# Patient Record
Sex: Male | Born: 1975 | Race: White | Hispanic: No | Marital: Married | Smoking: Former smoker
Health system: Southern US, Community
[De-identification: ages and names within clinical notes are randomized; demographics above are authoritative.]

## PROBLEM LIST (undated history)

## (undated) DIAGNOSIS — J45909 Unspecified asthma, uncomplicated: Secondary | ICD-10-CM

## (undated) HISTORY — PX: SHOULDER SURGERY: SHX246

## (undated) HISTORY — PX: KNEE ARTHROSCOPY WITH ANTERIOR CRUCIATE LIGAMENT (ACL) REPAIR: SHX5644

---

## 2011-08-06 ENCOUNTER — Ambulatory Visit (INDEPENDENT_AMBULATORY_CARE_PROVIDER_SITE_OTHER): Payer: 59

## 2011-08-06 DIAGNOSIS — L42 Pityriasis rosea: Secondary | ICD-10-CM

## 2016-07-16 HISTORY — PX: VASECTOMY: SHX75

## 2016-09-23 ENCOUNTER — Encounter (HOSPITAL_COMMUNITY): Payer: Self-pay | Admitting: Emergency Medicine

## 2016-09-23 ENCOUNTER — Ambulatory Visit (HOSPITAL_COMMUNITY)
Admission: EM | Admit: 2016-09-23 | Discharge: 2016-09-23 | Disposition: A | Discharge status: Home / Self Care | Payer: 59 | Attending: Nurse Practitioner | Admitting: Nurse Practitioner

## 2016-09-23 DIAGNOSIS — J45909 Unspecified asthma, uncomplicated: Secondary | ICD-10-CM | POA: Insufficient documentation

## 2016-09-23 DIAGNOSIS — R42 Dizziness and giddiness: Secondary | ICD-10-CM | POA: Insufficient documentation

## 2016-09-23 DIAGNOSIS — Z87891 Personal history of nicotine dependence: Secondary | ICD-10-CM | POA: Insufficient documentation

## 2016-09-23 HISTORY — DX: Unspecified asthma, uncomplicated: J45.909

## 2016-09-23 LAB — POCT I-STAT, CHEM 8
BUN: 22 mg/dL — ABNORMAL HIGH (ref 6–20)
CALCIUM ION: 1.16 mmol/L (ref 1.15–1.40)
CHLORIDE: 105 mmol/L (ref 101–111)
Creatinine, Ser: 1.4 mg/dL — ABNORMAL HIGH (ref 0.61–1.24)
GLUCOSE: 93 mg/dL (ref 65–99)
HCT: 42 % (ref 39.0–52.0)
Hemoglobin: 14.3 g/dL (ref 13.0–17.0)
Potassium: 4.3 mmol/L (ref 3.5–5.1)
Sodium: 140 mmol/L (ref 135–145)
TCO2: 27 mmol/L (ref 0–100)

## 2016-09-23 LAB — CBC WITH DIFFERENTIAL/PLATELET
Basophils Absolute: 0 10*3/uL (ref 0.0–0.1)
Basophils Relative: 0 %
EOS ABS: 0.1 10*3/uL (ref 0.0–0.7)
Eosinophils Relative: 2 %
HCT: 41.4 % (ref 39.0–52.0)
Hemoglobin: 14 g/dL (ref 13.0–17.0)
LYMPHS ABS: 2.2 10*3/uL (ref 0.7–4.0)
Lymphocytes Relative: 30 %
MCH: 30 pg (ref 26.0–34.0)
MCHC: 33.8 g/dL (ref 30.0–36.0)
MCV: 88.7 fL (ref 78.0–100.0)
MONOS PCT: 10 %
Monocytes Absolute: 0.7 10*3/uL (ref 0.1–1.0)
Neutro Abs: 4.2 10*3/uL (ref 1.7–7.7)
Neutrophils Relative %: 58 %
Platelets: 255 10*3/uL (ref 150–400)
RBC: 4.67 MIL/uL (ref 4.22–5.81)
RDW: 12.5 % (ref 11.5–15.5)
WBC: 7.3 10*3/uL (ref 4.0–10.5)

## 2016-09-23 LAB — COMPREHENSIVE METABOLIC PANEL
ALT: 25 U/L (ref 17–63)
ANION GAP: 9 (ref 5–15)
AST: 21 U/L (ref 15–41)
Albumin: 4.1 g/dL (ref 3.5–5.0)
Alkaline Phosphatase: 45 U/L (ref 38–126)
BUN: 18 mg/dL (ref 6–20)
CHLORIDE: 104 mmol/L (ref 101–111)
CO2: 25 mmol/L (ref 22–32)
Calcium: 9 mg/dL (ref 8.9–10.3)
Creatinine, Ser: 1.27 mg/dL — ABNORMAL HIGH (ref 0.61–1.24)
Glucose, Bld: 89 mg/dL (ref 65–99)
Potassium: 4.1 mmol/L (ref 3.5–5.1)
SODIUM: 138 mmol/L (ref 135–145)
Total Bilirubin: 0.4 mg/dL (ref 0.3–1.2)
Total Protein: 6.8 g/dL (ref 6.5–8.1)

## 2016-09-23 MED ORDER — MECLIZINE HCL 25 MG PO TABS: 25.0000 mg | ORAL_TABLET | Freq: Three times a day (TID) | ORAL | 0 refills | Status: DC | PRN

## 2016-09-23 NOTE — ED Triage Notes (Signed)
The [facility] with a complaint of bilateral lower back pain x 3 weeks off and on. The patient also complained of dizziness x 1 week.

## 2016-09-23 NOTE — Discharge Instructions (Signed)
I will call you when the lab results are back (It will most likely come back tomorrow).

## 2016-09-23 NOTE — ED Provider Notes (Signed)
CSN: [id number]     Arrival date & time 09/23/16  1850 History   First MD Initiated Contact with Patient 09/23/16 1917     Chief Complaint  Patient presents with  . Back Pain  . Dizziness   (Consider location/radiation/quality/duration/timing/severity/associated sxs/prior Treatment) Patient is otherwise a healthy 41 y.o. Male, with history of asthma that is well controlled and seldom uses his albuterol, presents today for dizziness of 1 week's duration that is getting worst and more frequent in the last 2 days accompany by a needling pricking sensation at his left lower back that "feels superficial" on his skin rather than something deep and this needle pricking sensation has been ongoing for 3 weeks.   Patient reports that sometimes he feels like the room is spinning a bit but overall more like a lightheadedness. He reports drinking the same amt of water that he has always been drinking; he doesn't think that he  Is dehydrated. He denies tinnitus, hearing loss or ear pain. He denies CP, SOB, Headache, visual disturbances. He denies muscle weakness, numbness or tingling sensation, or nausea. He denies any alleviating and aggravation factors. He states that dizzy spell would last for hours when it comes.   He reports that about 9 months ago is when he started to experience intermittent chest pain that eventually went away 3 months after successful smoking cessation. Patient reports that this chest pain has not returned. He did not experience any dizziness along with this chest pain in the past.   He does not have a PCP. It has been a long time since he had any labs done.         Past Medical History:  Diagnosis Date  . Asthma    Past Surgical History:  Procedure Laterality Date  . KNEE ARTHROSCOPY WITH ANTERIOR CRUCIATE LIGAMENT (ACL) REPAIR Left   . SHOULDER SURGERY Right    History reviewed. No pertinent family history. Social History  Substance Use Topics  . Smoking status:  Former Games developermoker  . Smokeless tobacco: Never Used  . Alcohol use Yes    Review of Systems  Constitutional:       As stated in the HPI    Allergies  Patient has no known allergies.  Home Medications   Prior to Admission medications   Medication Sig Start Date End Date Taking? Authorizing Provider  meclizine (ANTIVERT) 25 MG tablet Take 1 tablet (25 mg total) by mouth 3 (three) times daily as needed for dizziness. 09/23/16   Lucia EstelleFeng Azadeh Hyder, NP   Meds Ordered and Administered this Visit  Medications - No data to display  BP 103/70 (BP Location: Right Arm)   Pulse 70   Temp 98.5 F (36.9 C) (Oral)   Resp 18   SpO2 98%  Orthostatic VS for the past 24 hrs:  BP- Lying Pulse- Lying BP- Sitting Pulse- Sitting BP- Standing at 0 minutes Pulse- Standing at 0 minutes  09/23/16 1942 105/78 56 113/78 64 105/82 70    Physical Exam  Constitutional: He is oriented to person, place, and time. He appears well-developed and well-nourished.  HENT:  Head: Normocephalic and atraumatic.  Right Ear: External ear normal.  Left Ear: External ear normal.  Nose: Nose normal.  Mouth/Throat: Oropharynx is clear and moist. No oropharyngeal exudate.  TM pearly gray bilaterally. Negative nystagmus. EOMI  Eyes: Conjunctivae are normal. Pupils are equal, round, and reactive to light.  Neck: Normal range of motion. Neck supple.  Cardiovascular: Normal rate, regular rhythm and normal  heart sounds.   No murmur heard. Pulmonary/Chest: Effort normal and breath sounds normal.  Abdominal: Soft. Bowel sounds are normal. He exhibits no distension and no mass. There is no tenderness. There is no rebound.  Musculoskeletal: Normal range of motion.  Lymphadenopathy:    He has no cervical adenopathy.  Neurological: He is alert and oriented to person, place, and time. He displays normal reflexes. No cranial nerve deficit or sensory deficit. He exhibits normal muscle tone. Coordination normal.  Skin: Skin is warm and dry.   Nursing note and vitals reviewed.   Urgent Care Course     Procedures (including critical care time)  Labs Review Labs Reviewed  POCT I-STAT, CHEM 8 - Abnormal; Notable for the following:       Result Value   BUN 22 (*)    Creatinine, Ser 1.40 (*)    All other components within normal limits  CBC WITH DIFFERENTIAL/PLATELET  COMPREHENSIVE METABOLIC PANEL    Imaging Review No results found.  MDM   1. Dizziness    No clear etiology. Physical exam and neurologically intact. He appears well. He is stable to be discharged home.  Orthostatic negative. However I-stat shows mildly elevated BUN and Creatinine; this could be due to several factors. CBC with diff and CMP ordered to further evaluate. Will call patient with lab results. Meanwhile call PCP office and see if new patient appt could be moved up to next 1-2 weeks. RX for meclizine given to try. ER precaution discussed.     Lucia Estelle, NP 09/23/16 2021

## 2016-09-25 ENCOUNTER — Encounter: Payer: Self-pay | Admitting: Family Medicine

## 2016-09-25 ENCOUNTER — Ambulatory Visit (INDEPENDENT_AMBULATORY_CARE_PROVIDER_SITE_OTHER): Payer: 59 | Admitting: Family Medicine

## 2016-09-25 VITALS — BP 128/80 | HR 67 | Resp 12 | Ht 75.0 in | Wt 198.5 lb

## 2016-09-25 DIAGNOSIS — R0789 Other chest pain: Secondary | ICD-10-CM

## 2016-09-25 DIAGNOSIS — R42 Dizziness and giddiness: Secondary | ICD-10-CM

## 2016-09-25 DIAGNOSIS — R002 Palpitations: Secondary | ICD-10-CM | POA: Diagnosis not present

## 2016-09-25 NOTE — Progress Notes (Signed)
HPI:   ACUTE VISIT:  Chief Complaint  Patient presents with  . Dizziness    Mr.William Harding is a 41 y.o. male, who is here today with his wife complaining of 10 days of dizziness, which has ben constant for the past 3 days until this morning.  He describes dizziness as lightheadedness sensation, it does not happen while he is in bed or upon getting up, usually starts about an hour or so of being up. Today am it lasted about 1.5 hours.  She denies associated headache, visual changes, ear ache, tinnitus, hearing loss, dyspnea, nausea, vomiting, or focal weakness.  He has not identified exacerbating or alleviating factors. He was evaluated for similar symptom In the ER on 09/23/16, he was prescribed Meclizine but did not help.  Lab Results  Component Value Date   WBC 7.3 09/23/2016   HGB 14.0 09/23/2016   HCT 41.4 09/23/2016   MCV 88.7 09/23/2016   PLT 255 09/23/2016     Lab Results  Component Value Date   CREATININE 1.27 (H) 09/23/2016   BUN 18 09/23/2016   NA 138 09/23/2016   K 4.1 09/23/2016   CL 104 09/23/2016   CO2 25 09/23/2016    -He is also c/o intermittent sharp, no radiated, chest pain since 01/2016 and improved after smoking sensation 06/2016, almost resolved.States that now it happens "once in a while." He can pinpoint areas of pain on his left chest wall, medial and lateral. No edema,erythema,or rash. Pain was at rest,llasted hours, not sure about exacerbating or alleviating factors.  -Palpitation episodes, he cannot described sensation, denies feeling his heart skipping beats. He has had 2 episodes in the past 2 months, feels like he is "going to pas out", lightheaded, episode lasts about 5 seconds. No associated chest pain,dyspnea,or diaphoresis. It happens at rest. He is afraid of having atrial fib.  He denies anxiety or unusual stress.    Review of Systems  Constitutional: Negative for activity change, appetite change, fatigue and fever.  HENT:  Negative for congestion, ear pain, hearing loss, mouth sores, nosebleeds, sinus pressure, sore throat, tinnitus and trouble swallowing.   Eyes: Negative for redness and visual disturbance.  Respiratory: Negative for cough, shortness of breath and wheezing.   Cardiovascular: Positive for chest pain and palpitations. Negative for leg swelling.  Gastrointestinal: Positive for nausea. Negative for abdominal pain and vomiting.  Endocrine: Negative for cold intolerance, heat intolerance, polydipsia, polyphagia and polyuria.  Genitourinary: Negative for decreased urine volume, dysuria and hematuria.  Musculoskeletal: Negative for back pain, gait problem and myalgias.  Skin: Negative for color change and rash.  Allergic/Immunologic: Negative for environmental allergies.  Neurological: Positive for light-headedness. Negative for tremors, syncope, speech difficulty, weakness, numbness and headaches.  Hematological: Negative for adenopathy. Does not bruise/bleed easily.  Psychiatric/Behavioral: Negative for confusion and sleep disturbance. The patient is not nervous/anxious.       Current Outpatient Prescriptions on File Prior to Visit  Medication Sig Dispense Refill  . meclizine (ANTIVERT) 25 MG tablet Take 1 tablet (25 mg total) by mouth 3 (three) times daily as needed for dizziness. 30 tablet 0   No current facility-administered medications on file prior to visit.      Past Medical History:  Diagnosis Date  . Asthma    No Known Allergies  Social History   Social History  . Marital status: Married    Spouse name: N/A  . Number of children: N/A  . Years of education: N/A  Social History Main Topics  . Smoking status: Former Games developer  . Smokeless tobacco: Never Used  . Alcohol use Yes  . Drug use: Yes    Types: Marijuana  . Sexual activity: Not Asked   Other Topics Concern  . None   Social History Narrative  . None    Vitals:   09/25/16 1448  BP: 128/80  Pulse: 67  Resp:  12  O2 sat 98% at RA. Body mass index is 24.81 kg/m.   Physical Exam  Nursing note and vitals reviewed. Constitutional: He is oriented to person, place, and time. He appears well-developed and well-nourished. No distress.  HENT:  Head: Atraumatic.  Right Ear: Hearing, tympanic membrane, external ear and ear canal normal.  Left Ear: Hearing, tympanic membrane, external ear and ear canal normal.  Mouth/Throat: Oropharynx is clear and moist and mucous membranes are normal.  Lightheadedness is not elicited by head movement.  Eyes: Conjunctivae and EOM are normal. Pupils are equal, round, and reactive to light.  Neck: Normal range of motion. No JVD present. Carotid bruit is not present. No tracheal deviation present. No thyroid mass and no thyromegaly present.  Mild fulness (1-2 cm area) noted upon palpation of right posterior cervical area. I cannot define clear borders,no tender,and barely appreciated upon inspection.   Cardiovascular: Normal rate.  An irregular rhythm present.  No murmur heard. Pulses:      Dorsalis pedis pulses are 2+ on the right side, and 2+ on the left side.  Respiratory: Effort normal and breath sounds normal. No stridor. No respiratory distress.  GI: Soft. He exhibits no mass. There is no tenderness.  Musculoskeletal: He exhibits no edema or tenderness.  Lymphadenopathy:    He has cervical adenopathy.       Right cervical: Posterior cervical (? Of enlarged lymph node) adenopathy present.       Right: No supraclavicular adenopathy present.       Left: No supraclavicular adenopathy present.  Neurological: He is alert and oriented to person, place, and time. No cranial nerve deficit or sensory deficit. Coordination and gait normal.  Reflex Scores:      Patellar reflexes are 2+ on the right side and 2+ on the left side. Skin: Skin is warm. No rash noted. No erythema.  Psychiatric: His mood appears anxious.  Well groomed, good eye contact.      ASSESSMENT AND  PLAN:  Armend was seen today for dizziness.  Diagnoses and all orders for this visit:  Lightheadedness  This problem seems to be different to lightheaded sensation he is also reporting with palpitations.  We discussed possible etiologies,it does not sound like vertigo. Fall precautions discussed.  He has recent lab work,otherwsie negative. Cr mildly elevated, it was 1.4 and then 1.27. He denies using any protein shakes os supplements to "buld muscle." Clearly instructed about warning signs. He has an appt in a couple weeks to establish care,we will plan of fasting labs then.  -     EKG 12-Lead -     Ambulatory referral to Cardiology  Heart palpitations  Because episodes of palpitation associated with feeling like "passing out",presyncope, cardiology consultation recommended, ? Arrhythmia. EKG today otherwise normal,sinus arrhythmia. No other EKG for comparison. Instructed about warning signs. .  -     EKG 12-Lead  Chest wall pain  Resolved. Provided Hx suggest musculoskeletal type pain. I do not think imaging is needed today, lung auscultation normal. F/U as needed.  -     EKG 12-Lead   -  In regard to neck/cervical evaluation, we will re-evaluate next OV. He and his wife will monitor area for changes.   Return for Keep next appt..     -Mr.Aryan Sparks was advised to return or notify a doctor immediately if symptoms worsen or new concerns arise.       Brittani Purdum G. [place], MD  [facility]. [place] office.

## 2016-09-25 NOTE — Patient Instructions (Signed)
A few things to remember from today's visit:   Lightheadedness - Plan: EKG 12-Lead, Ambulatory referral to Cardiology  Fall precautions.  Adequate hydration.  Please be sure medication list is accurate. If a new problem present, please set up appointment sooner than planned today.

## 2016-09-25 NOTE — Progress Notes (Signed)
Pre visit review using our clinic review tool, if applicable. No additional management support is needed unless otherwise documented below in the visit note. 

## 2016-09-26 ENCOUNTER — Ambulatory Visit: Payer: 59 | Admitting: Family Medicine

## 2016-09-27 ENCOUNTER — Encounter: Payer: Self-pay | Admitting: Cardiology

## 2016-09-27 NOTE — Progress Notes (Signed)
Cardiology Office Note   Date:  09/30/2016   ID:  William Harding, DOB July 05, 1976, MRN [id number]  PCP:  Betty SwazilandJordan, MD  Cardiologist:   Rollene RotundaJames Sanjay Broadfoot, MD  Referring:  Betty [place]Jordan, MD   Chief Complaint  Patient presents with  . Dizziness      History of Present Illness: William PerchesJason Harb is a 41 y.o. male who presents for evaluation of dizziness and palpitations.  He has no prior cardiac history. However, he has long-standing smoking history. Last year he was having some chest discomfort. This was under his left chest and radiating around slightly to his axilla. This was somewhat fleeting that there was recurrence of this. It would happen at rest. He didn't notice it with physical activity. He was smoking at least a half pack of cigarettes a day at that time and had been for years. He stopped smoking in December and says this discomfort has improved. He also notices palpitations. This started about 2 months ago. He feels a flip-flop or lightheadedness. He doesn't describe heart racing. This seems to happen at rest and not be brought on by exertion. He denies any presyncope or syncope. He does have dizziness. He had this for 3 days last week. Seems to stay on him for most of the day when it does happen. He doesn't describe positional changes. He does exercise. With his exercise he can bring on any of these symptoms. He drinks one be coffee per day. He did notice he was having more palpitations when he drank alcohol he was drinking about 3 glasses of wine per night. He's really reduce this.  Past Medical History:  Diagnosis Date  . Asthma     Past Surgical History:  Procedure Laterality Date  . KNEE ARTHROSCOPY WITH ANTERIOR CRUCIATE LIGAMENT (ACL) REPAIR Left   . SHOULDER SURGERY Right      No current outpatient prescriptions on file.   No current facility-administered medications for this visit.     Allergies:   Patient has no known allergies.    Social History:  The patient   reports that he has quit smoking. His smoking use included Cigarettes. He has never used smokeless tobacco. He reports that he drinks alcohol. He reports that he uses drugs, including Marijuana.   Family History:  The patient's  family history includes Cancer in his mother; Diabetes in his mother; Peripheral vascular disease in his father.    ROS:  Please see the history of present illness.   Otherwise, review of systems are positive for none.   All other systems are reviewed and negative.    PHYSICAL EXAM: VS:  BP 120/74 (BP Location: Left Arm, Cuff Size: Normal)   Pulse 75   Ht 6\' 3"  (1.905 m)   Wt 197 lb (89.4 kg)   BMI 24.62 kg/m  , BMI Body mass index is 24.62 kg/m. GENERAL:  Well appearing HEENT:  Pupils equal round and reactive, fundi not visualized, oral mucosa unremarkable NECK:  No jugular venous distention, waveform within normal limits, carotid upstroke brisk and symmetric, no bruits, no thyromegaly LYMPHATICS:  No cervical, inguinal adenopathy LUNGS:  Clear to auscultation bilaterally BACK:  No CVA tenderness CHEST:  Unremarkable HEART:  PMI not displaced or sustained,S1 and S2 within normal limits, no S3, no S4, no clicks, no rubs, no murmurs ABD:  Flat, positive bowel sounds normal in frequency in pitch, no bruits, no rebound, no guarding, no midline pulsatile mass, no hepatomegaly, no splenomegaly EXT:  2  plus pulses throughout, no edema, no cyanosis no clubbing SKIN:  No rashes no nodules NEURO:  Cranial nerves II through XII grossly intact, motor grossly intact throughout PSYCH:  Cognitively intact, oriented to person place and time    EKG:  EKG is not ordered today. The ekg ordered 09/25/16 demonstrates sinus rhythm, rate 60, axis within normal limits, intervals within normal limits, no acute ST-T wave changes.    Recent Labs: 09/23/2016: ALT 25; BUN 18; Creatinine, Ser 1.27; Hemoglobin 14.0; Platelets 255; Potassium 4.1; Sodium 138    Lipid Panel No results  found for: CHOL, TRIG, HDL, CHOLHDL, VLDL, LDLCALC, LDLDIRECT    Wt Readings from Last 3 Encounters:  09/28/16 197 lb (89.4 kg)  09/25/16 198 lb 8 oz (90 kg)      Other studies Reviewed: Additional studies/ records that were reviewed today include: None. Review of the above records demonstrates:  Please see elsewhere in the note.     ASSESSMENT AND PLAN:  PALPITATIONS:  At this point he's probably describing PACs or PVCs. We discussed symptomatic treatment of these but at this point will probably ignore them. He is going to think about getting [facility] for evaluation of his palpitations.     DIZZINESS:Begin he'll evaluate this as above. Strongly suspect a cardiac etiology. This doesn't sound like it's associated with palpitations. He is not describing orthostatic symptoms. No other change in therapy is indicated at this point. He might consider ENT evaluation if this persists.   CHEST PAIN:  This is atypical. I think the pretest probability of obstructive coronary disease is low. He does have crepitus risk factors with his long-standing smoking history. I'm going to order coronary calcium score.      Current medicines are reviewed at length with the patient today.  The patient does not have concerns regarding medicines.  The following changes have been made:  no change  Labs/ tests ordered today include:   Orders Placed This Encounter  Procedures  . CT CARDIAC SCORING     Disposition:   FU with me as needed.      Signed, Rollene Rotunda, MD  09/30/2016 3:10 PM    [facility]

## 2016-09-28 ENCOUNTER — Ambulatory Visit (INDEPENDENT_AMBULATORY_CARE_PROVIDER_SITE_OTHER): Payer: 59 | Admitting: Cardiology

## 2016-09-28 ENCOUNTER — Encounter: Payer: Self-pay | Admitting: Cardiology

## 2016-09-28 VITALS — BP 120/74 | HR 75 | Ht 75.0 in | Wt 197.0 lb

## 2016-09-28 DIAGNOSIS — R42 Dizziness and giddiness: Secondary | ICD-10-CM

## 2016-09-28 DIAGNOSIS — R079 Chest pain, unspecified: Secondary | ICD-10-CM | POA: Diagnosis not present

## 2016-09-28 DIAGNOSIS — R002 Palpitations: Secondary | ICD-10-CM

## 2016-09-28 NOTE — Patient Instructions (Signed)
Medication Instructions:  Continue current medications  Labwork: None ordered  Testing/Procedures: Your physician has requested that you have a Coronary Calcium Score. This test [facility] done [facility] [address] [facility].   Follow-Up: Your physician recommends that you schedule a follow-up appointment in: As Needed   Any Other Special Instructions Will Be Listed Below (If Applicable).     If you need a refill on your cardiac medications before your next appointment, please call your pharmacy.

## 2016-09-30 ENCOUNTER — Encounter: Payer: Self-pay | Admitting: Cardiology

## 2016-09-30 DIAGNOSIS — R002 Palpitations: Secondary | ICD-10-CM | POA: Insufficient documentation

## 2016-09-30 DIAGNOSIS — R079 Chest pain, unspecified: Secondary | ICD-10-CM | POA: Insufficient documentation

## 2016-09-30 DIAGNOSIS — R42 Dizziness and giddiness: Secondary | ICD-10-CM | POA: Insufficient documentation

## 2016-10-03 ENCOUNTER — Ambulatory Visit (INDEPENDENT_AMBULATORY_CARE_PROVIDER_SITE_OTHER)
Admission: RE | Admit: 2016-10-03 | Discharge: 2016-10-03 | Disposition: A | Discharge status: Home / Self Care | Payer: Self-pay | Source: Ambulatory Visit | Attending: Cardiology | Admitting: Cardiology

## 2016-10-03 DIAGNOSIS — R079 Chest pain, unspecified: Secondary | ICD-10-CM

## 2016-10-08 ENCOUNTER — Ambulatory Visit: Payer: 59 | Admitting: Family Medicine

## 2016-10-09 ENCOUNTER — Ambulatory Visit: Payer: 59 | Admitting: Cardiology

## 2016-10-10 NOTE — Progress Notes (Signed)
HPI:   Mr.Marcelus Collymore is a 41 y.o. male, who is here today to establish care.  I saw him last on 09/25/16 for acute visit,palpitations and dizziness.  Since his last OV he was evaluated by cardiologists, Dr Antoine Poche. Coronary calcium score was obtained, 10/03/16: It was 0. Chest pain has resolved.  Former PCP: N/A  Concerns today: None  Lightheadedness: Improved. He had an episode this morning that last a few seconds but this one was the first one since his last OV.   Palpitations: Resolved.  He is still exercising regularly and feels better after doing so. He denies any chest discomfort, dyspnea,or palpitations with exertions.  He uses marijuana occasionally. Denies depressed mood or anxiety.  Lab Results  Component Value Date   CREATININE 1.27 (H) 09/23/2016   BUN 18 09/23/2016   NA 138 09/23/2016   K 4.1 09/23/2016   CL 104 09/23/2016   CO2 25 09/23/2016   He denies alcohol abuse,drinks wine during weekends, 2-3 glasses at the time.   Review of Systems  Constitutional: Negative for activity change, appetite change, fatigue, fever and unexpected weight change.  HENT: Negative for mouth sores, nosebleeds, sore throat and trouble swallowing.   Eyes: Negative for pain and visual disturbance.  Respiratory: Negative for cough, shortness of breath and wheezing.   Cardiovascular: Negative for chest pain, palpitations and leg swelling.  Gastrointestinal: Negative for abdominal pain, nausea and vomiting.       No changes in bowel habits.  Genitourinary: Negative for decreased urine volume and hematuria.  Musculoskeletal: Negative for gait problem and myalgias.  Skin: Negative for rash.  Neurological: Negative for syncope, weakness and headaches.  Hematological: Negative for adenopathy. Does not bruise/bleed easily.  Psychiatric/Behavioral: Negative for confusion and sleep disturbance. The patient is not nervous/anxious.       No current outpatient prescriptions  on file prior to visit.   No current facility-administered medications on file prior to visit.      Past Medical History:  Diagnosis Date  . Asthma    No Known Allergies  Family History  Problem Relation Age of Onset  . Diabetes Mother   . Cancer Mother     breast cancer  . Peripheral vascular disease Father   . Hyperlipidemia Father   . Diabetes Maternal Grandmother   . Stroke Maternal Grandmother   . Cancer Paternal Uncle 48    lung    Social History   Social History  . Marital status: Divorced    Spouse name: N/A  . Number of children: 1  . Years of education: N/A   Social History Main Topics  . Smoking status: Former Smoker    Types: Cigarettes  . Smokeless tobacco: Never Used     Comment: Quit Dec 2017  . Alcohol use Yes  . Drug use: Yes    Types: Marijuana  . Sexual activity: Yes   Other Topics Concern  . None   Social History Narrative  . None    Vitals:   10/11/16 1154  BP: 120/80  Pulse: 71  Resp: 12   O2 sat 99% at RA Body mass index is 24.65 kg/m.   Physical Exam  Nursing note and vitals reviewed. Constitutional: He is oriented to person, place, and time. He appears well-developed and well-nourished. No distress.  HENT:  Head: Atraumatic.  Mouth/Throat: Uvula is midline, oropharynx is clear and moist and mucous membranes are normal.  Eyes: Conjunctivae and EOM are normal. Pupils are  equal, round, and reactive to light.  Cardiovascular: Normal rate and regular rhythm.   No murmur heard. Pulses:      Dorsalis pedis pulses are 2+ on the right side, and 2+ on the left side.  Respiratory: Effort normal and breath sounds normal. No respiratory distress.  GI: Soft. He exhibits no mass. There is no hepatomegaly. There is no tenderness.  Musculoskeletal: He exhibits no edema.  Lymphadenopathy:    He has no cervical adenopathy.  Neurological: He is alert and oriented to person, place, and time. Gait normal.  Skin: Skin is warm. No rash  noted. No erythema.  Psychiatric: He has a normal mood and affect.  Well groomed, good eye contact.      ASSESSMENT AND PLAN:   Barbara CowerJason was seen today for establish care.  Diagnoses and all orders for this visit:  Dizziness  Improved. Adequate hydration,healthy diet recommended. Alcohol intake: avoid > 8 oz of daily alcohol. Instructed about warning signs. F/U as needed.  Lipid screening -     Lipid panel  Palpitations  Resolved. Limit caffeine and alcohol intake. Instructed about warning signs. F/U as needed.      Eirene Rather G. [place], MD  [facility]. [place] office.

## 2016-10-11 ENCOUNTER — Ambulatory Visit (INDEPENDENT_AMBULATORY_CARE_PROVIDER_SITE_OTHER): Payer: 59 | Admitting: Family Medicine

## 2016-10-11 ENCOUNTER — Encounter: Payer: Self-pay | Admitting: Family Medicine

## 2016-10-11 VITALS — BP 120/80 | HR 71 | Resp 12 | Ht 75.0 in | Wt 197.2 lb

## 2016-10-11 DIAGNOSIS — R002 Palpitations: Secondary | ICD-10-CM

## 2016-10-11 DIAGNOSIS — R42 Dizziness and giddiness: Secondary | ICD-10-CM

## 2016-10-11 DIAGNOSIS — Z13220 Encounter for screening for lipoid disorders: Secondary | ICD-10-CM | POA: Diagnosis not present

## 2016-10-11 LAB — LIPID PANEL
CHOL/HDL RATIO: 6
Cholesterol: 280 mg/dL — ABNORMAL HIGH (ref 0–200)
HDL: 48.6 mg/dL (ref >39.00)
NonHDL: 231.12
TRIGLYCERIDES: 222 mg/dL — AB (ref 0.0–149.0)
VLDL: 44.4 mg/dL — AB (ref 0.0–40.0)

## 2016-10-11 LAB — LDL CHOLESTEROL, DIRECT: Direct LDL: 177 mg/dL

## 2016-10-11 NOTE — Progress Notes (Signed)
Pre visit review using our clinic review tool, if applicable. No additional management support is needed unless otherwise documented below in the visit note. 

## 2016-10-11 NOTE — Patient Instructions (Signed)
A few things to remember from today's visit:   Lipid screening - Plan: Lipid panel  Palpitations   We have ordered labs or studies at this visit.  It can take up to 1-2 weeks for results and processing. IF results require follow up or explanation, we will call you with instructions. Clinically stable results will be released to your [facility]. If you have not heard from us or cannot find your results in [facility] in 2 weeks please contact our office at [phone].  If you are not yet signed up for [facility], please consider signing up  Please be sure medication list is accurate. If a new problem present, please set up appointment sooner than planned today.

## 2016-10-14 ENCOUNTER — Encounter: Payer: Self-pay | Admitting: Family Medicine

## 2016-10-14 DIAGNOSIS — E782 Mixed hyperlipidemia: Secondary | ICD-10-CM

## 2016-10-14 HISTORY — DX: Mixed hyperlipidemia: E78.2

## 2016-10-15 ENCOUNTER — Other Ambulatory Visit: Payer: Self-pay

## 2016-10-15 MED ORDER — SIMVASTATIN 20 MG PO TABS: ORAL_TABLET | ORAL | 1 refills | Status: DC

## 2016-11-01 ENCOUNTER — Other Ambulatory Visit: Payer: Self-pay

## 2016-11-01 ENCOUNTER — Encounter: Payer: Self-pay | Admitting: Neurology

## 2016-11-01 ENCOUNTER — Encounter (HOSPITAL_COMMUNITY): Payer: Self-pay | Admitting: Emergency Medicine

## 2016-11-01 ENCOUNTER — Emergency Department (HOSPITAL_COMMUNITY)
Admission: EM | Admit: 2016-11-01 | Discharge: 2016-11-01 | Disposition: A | Discharge status: Home / Self Care | Payer: 59 | Attending: Emergency Medicine | Admitting: Emergency Medicine

## 2016-11-01 DIAGNOSIS — R42 Dizziness and giddiness: Secondary | ICD-10-CM | POA: Diagnosis present

## 2016-11-01 DIAGNOSIS — H5500 Unspecified nystagmus: Secondary | ICD-10-CM

## 2016-11-01 DIAGNOSIS — J45909 Unspecified asthma, uncomplicated: Secondary | ICD-10-CM | POA: Diagnosis not present

## 2016-11-01 DIAGNOSIS — Z87891 Personal history of nicotine dependence: Secondary | ICD-10-CM | POA: Diagnosis not present

## 2016-11-01 DIAGNOSIS — Z79899 Other long term (current) drug therapy: Secondary | ICD-10-CM | POA: Insufficient documentation

## 2016-11-01 MED ORDER — MECLIZINE HCL 25 MG PO TABS: 25.0000 mg | ORAL_TABLET | Freq: Three times a day (TID) | ORAL | 0 refills | Status: DC | PRN

## 2016-11-01 NOTE — ED Provider Notes (Signed)
[facility]-EMERGENCY DEPT Provider Note   CSN: [id number] Arrival date & time: 11/01/16  0935     History   Chief Complaint Chief Complaint  Patient presents with  . Dizziness    HPI William Harding is a 41 y.o. male.  41 yo M with a chief complaints of lightheadedness. This been going on for the past month and a half. Happened spontaneously. Usually happens in the afternoons. He describes a sensation as the feeling of seasickness without the rocking. Nothing seems to make this better or worse. Denies head injury denies vomiting. Denies hearing loss or tinnitus. Has been able to exercise vigorously without symptoms. Exercise usually makes it better. Denies chest pain or shortness of breath. Denies headaches. Denies trouble with his vision. Denies unilateral weakness or numbness.   The history is provided by the patient and the spouse.  Dizziness  Quality:  Lightheadedness Severity:  Moderate Onset quality:  Gradual Duration:  2 months Timing:  Intermittent Progression:  Unchanged Chronicity:  New Context: not when bending over and not with head movement   Context comment:  Spontaneously Relieved by:  Nothing Worsened by:  Nothing Ineffective treatments:  None tried Associated symptoms: no chest pain, no diarrhea, no headaches, no palpitations, no shortness of breath and no vomiting     Past Medical History:  Diagnosis Date  . Asthma     Patient Active Problem List   Diagnosis Date Noted  . Mixed hyperlipidemia 10/14/2016  . Chest pain 09/30/2016  . Dizziness 09/30/2016  . Palpitations 09/30/2016    Past Surgical History:  Procedure Laterality Date  . KNEE ARTHROSCOPY WITH ANTERIOR CRUCIATE LIGAMENT (ACL) REPAIR Left   . SHOULDER SURGERY Right   . VASECTOMY  2018       Home Medications    Prior to Admission medications   Medication Sig Start Date End Date Taking? Authorizing Provider  meclizine (ANTIVERT) 25 MG tablet Take 1 tablet (25 mg total) by mouth 3 (three)  times daily as needed for dizziness. 11/01/16   Melene Plan, DO  simvastatin (ZOCOR) 20 MG tablet Take 1 tablet by mouth daily with supper. 10/15/16   Betty G [place], MD    Family History Family History  Problem Relation Age of Onset  . Diabetes Mother   . Cancer Mother     breast cancer  . Peripheral vascular disease Father   . Hyperlipidemia Father   . Diabetes Maternal Grandmother   . Stroke Maternal Grandmother   . Cancer Paternal Uncle 55    lung    Social History Social History  Substance Use Topics  . Smoking status: Former Smoker    Types: Cigarettes  . Smokeless tobacco: Never Used     Comment: Quit Dec 2017  . Alcohol use Yes     Allergies   Patient has no known allergies.   Review of Systems Review of Systems  Constitutional: Negative for chills and fever.  HENT: Negative for congestion and facial swelling.   Eyes: Negative for discharge and visual disturbance.  Respiratory: Negative for shortness of breath.   Cardiovascular: Negative for chest pain and palpitations.  Gastrointestinal: Negative for abdominal pain, diarrhea and vomiting.  Musculoskeletal: Negative for arthralgias and myalgias.  Skin: Negative for color change and rash.  Neurological: Positive for dizziness and light-headedness. Negative for tremors, syncope and headaches.  Psychiatric/Behavioral: Negative for confusion and dysphoric mood.     Physical Exam Updated Vital Signs BP 134/89 (BP Location: Left Arm)   Pulse 69  Temp 97.8 F (36.6 C) (Oral)   Resp 16   SpO2 100%   Physical Exam  Constitutional: He is oriented to person, place, and time. He appears well-developed and well-nourished.  HENT:  Head: Normocephalic and atraumatic.  Left TM normal. Right TM obscured by wax.  Eyes: EOM are normal. Pupils are equal, round, and reactive to light.  Neck: Normal range of motion. Neck supple. No JVD present.  Cardiovascular: Normal rate and regular rhythm.  Exam reveals no gallop and  no friction rub.   No murmur heard. Pulmonary/Chest: No respiratory distress. He has no wheezes.  Abdominal: He exhibits no distension. There is no rebound and no guarding.  Musculoskeletal: Normal range of motion.  Neurological: He is alert and oriented to person, place, and time. He has normal strength. No cranial nerve deficit or sensory deficit. He displays a negative Romberg sign. Coordination and gait normal. GCS eye subscore is 4. GCS verbal subscore is 5. GCS motor subscore is 6.  Reflex Scores:      Tricep reflexes are 2+ on the right side and 2+ on the left side.      Bicep reflexes are 2+ on the right side and 2+ on the left side.      Brachioradialis reflexes are 2+ on the right side and 2+ on the left side.      Patellar reflexes are 2+ on the right side and 2+ on the left side.      Achilles reflexes are 2+ on the right side and 2+ on the left side. Left-sided fast going nystagmus Dix-Hallpike was negative on that side  Skin: No rash noted. No pallor.  Psychiatric: He has a normal mood and affect. His behavior is normal.  Nursing note and vitals reviewed.    ED Treatments / Results  Labs (all labs ordered are listed, but only abnormal results are displayed) Labs Reviewed - No data to display  EKG  EKG Interpretation  Date/Time:  Thursday November 01 2016 09:39:01 EDT Ventricular Rate:  71 PR Interval:  182 QRS Duration: 86 QT Interval:  380 QTC Calculation: 412 R Axis:   94 Text Interpretation:  Normal sinus rhythm with sinus arrhythmia Rightward axis Borderline ECG No old tracing to compare Confirmed by Vernessa Likes MD, DANIEL ([id number]) on 11/01/2016 10:07:27 AM       Radiology No results found.  Procedures Procedures (including critical care time)  Medications Ordered in ED Medications - No data to display   Initial Impression / Assessment and Plan / ED Course  I have reviewed the triage vital signs and the nursing notes.  Pertinent labs & imaging results that  were available during my care of the patient were reviewed by me and considered in my medical decision making (see chart for details).     41 yo M with a chief complaint of lightheadedness. Going on for a month and a half. Has seen urgent care 3 as well as cardiology. Cardiology does not feel like this is cardiogenic in nature. As patient is continuing to have symptoms and has some left-sided nystagmus suggested follow-up with ENT as well as neurology. Start on meclizine.  10:14 AM:  I have discussed the diagnosis/risks/treatment options with the patient and family and believe the pt to be eligible for discharge home to follow-up with Neuro, ENT, PCP. We also discussed returning to the ED immediately if new or worsening sx occur. We discussed the sx which are most concerning (e.g., inability to walk, exertional symptoms.  Syncope) that necessitate immediate return. Medications administered to the patient during their visit and any new prescriptions provided to the patient are listed below.  Medications given during this visit Medications - No data to display   The patient appears reasonably screen and/or stabilized for discharge and I doubt any other medical condition or other [facility] requiring further screening, evaluation, or treatment in the ED at this time prior to discharge.    Final Clinical Impressions(s) / ED Diagnoses   Final diagnoses:  Lightheaded  Nystagmus    New Prescriptions New Prescriptions   MECLIZINE (ANTIVERT) 25 MG TABLET    Take 1 tablet (25 mg total) by mouth 3 (three) times daily as needed for dizziness.     Melene Plan, DO 11/01/16 1014

## 2016-11-01 NOTE — Discharge Instructions (Signed)
Follow up with neurology and ENT for further evaluation.  Return to the ED for symptoms during exercise, if you pass out, or if you are unable to walk due to dizziness.

## 2016-11-01 NOTE — ED Triage Notes (Signed)
Pt sts dizziness x 1.5 months; pt sent here for further eval

## 2016-11-01 NOTE — ED Notes (Signed)
Got patient ready for discharge 

## 2016-11-14 DIAGNOSIS — R42 Dizziness and giddiness: Secondary | ICD-10-CM | POA: Diagnosis not present

## 2016-12-31 ENCOUNTER — Ambulatory Visit: Payer: 59 | Admitting: Neurology

## 2017-01-25 DIAGNOSIS — S83512D Sprain of anterior cruciate ligament of left knee, subsequent encounter: Secondary | ICD-10-CM | POA: Diagnosis not present

## 2017-01-25 DIAGNOSIS — M25512 Pain in left shoulder: Secondary | ICD-10-CM | POA: Diagnosis not present

## 2017-01-25 DIAGNOSIS — M25562 Pain in left knee: Secondary | ICD-10-CM | POA: Diagnosis not present

## 2017-02-01 DIAGNOSIS — M25562 Pain in left knee: Secondary | ICD-10-CM | POA: Diagnosis not present

## 2017-02-08 ENCOUNTER — Ambulatory Visit: Payer: 59 | Admitting: Neurology

## 2017-02-15 DIAGNOSIS — S83282A Other tear of lateral meniscus, current injury, left knee, initial encounter: Secondary | ICD-10-CM | POA: Diagnosis not present

## 2017-02-15 DIAGNOSIS — M1712 Unilateral primary osteoarthritis, left knee: Secondary | ICD-10-CM | POA: Diagnosis not present

## 2017-04-04 ENCOUNTER — Encounter: Payer: Self-pay | Admitting: Family Medicine

## 2017-04-25 DIAGNOSIS — Z0100 Encounter for examination of eyes and vision without abnormal findings: Secondary | ICD-10-CM | POA: Diagnosis not present

## 2017-04-30 DIAGNOSIS — M25512 Pain in left shoulder: Secondary | ICD-10-CM | POA: Diagnosis not present

## 2017-05-07 DIAGNOSIS — M25512 Pain in left shoulder: Secondary | ICD-10-CM | POA: Diagnosis not present

## 2017-05-13 DIAGNOSIS — M7502 Adhesive capsulitis of left shoulder: Secondary | ICD-10-CM | POA: Diagnosis not present

## 2017-07-11 DIAGNOSIS — M7502 Adhesive capsulitis of left shoulder: Secondary | ICD-10-CM | POA: Diagnosis not present

## 2017-07-12 DIAGNOSIS — M7502 Adhesive capsulitis of left shoulder: Secondary | ICD-10-CM | POA: Diagnosis not present

## 2017-07-17 DIAGNOSIS — M7502 Adhesive capsulitis of left shoulder: Secondary | ICD-10-CM | POA: Diagnosis not present

## 2017-07-22 DIAGNOSIS — M7502 Adhesive capsulitis of left shoulder: Secondary | ICD-10-CM | POA: Diagnosis not present

## 2017-07-24 DIAGNOSIS — M7502 Adhesive capsulitis of left shoulder: Secondary | ICD-10-CM | POA: Diagnosis not present

## 2017-07-29 DIAGNOSIS — M7502 Adhesive capsulitis of left shoulder: Secondary | ICD-10-CM | POA: Diagnosis not present

## 2017-07-31 DIAGNOSIS — M7502 Adhesive capsulitis of left shoulder: Secondary | ICD-10-CM | POA: Diagnosis not present

## 2017-08-07 DIAGNOSIS — M7502 Adhesive capsulitis of left shoulder: Secondary | ICD-10-CM | POA: Diagnosis not present

## 2017-08-12 DIAGNOSIS — M7502 Adhesive capsulitis of left shoulder: Secondary | ICD-10-CM | POA: Diagnosis not present

## 2017-08-14 DIAGNOSIS — M7502 Adhesive capsulitis of left shoulder: Secondary | ICD-10-CM | POA: Diagnosis not present

## 2017-08-21 DIAGNOSIS — M7502 Adhesive capsulitis of left shoulder: Secondary | ICD-10-CM | POA: Diagnosis not present

## 2017-08-27 DIAGNOSIS — M7502 Adhesive capsulitis of left shoulder: Secondary | ICD-10-CM | POA: Diagnosis not present

## 2017-09-02 DIAGNOSIS — M7502 Adhesive capsulitis of left shoulder: Secondary | ICD-10-CM | POA: Diagnosis not present

## 2017-09-10 DIAGNOSIS — M7502 Adhesive capsulitis of left shoulder: Secondary | ICD-10-CM | POA: Diagnosis not present

## 2017-10-02 DIAGNOSIS — M7502 Adhesive capsulitis of left shoulder: Secondary | ICD-10-CM | POA: Diagnosis not present

## 2017-10-14 NOTE — Progress Notes (Signed)
HPI:  Mr. William PerchesJason Querry is a 42 y.o.male here today for his routine physical examination.  Last CPE: 09/2017. He lives with his wife and his sone some weekends.  Regular exercise 3 or more times per week: He exercises daily Following a healthy diet: Yes   Chronic medical problems: HLD, elevated Cr  Hx of STD's: Negative  There is no immunization history on file for this patient.    -Negative for high alcohol intake or Hx of illicit drug use. Former smoker, quit about 2 years ago.  Smoked intermittently since age 42, max 5-8 cigarettes/day.    Hyperlipidemia:  Currently on nonpharmacologic treatment, he never started simvastatin. Following a low fat diet: Yes.   Lab Results  Component Value Date   CHOL 280 (H) 10/11/2016   HDL 48.60 10/11/2016   LDLDIRECT 177.0 10/11/2016   TRIG 222.0 (H) 10/11/2016   CHOLHDL 6 10/11/2016    He has been seen in the past for episodes of dizziness and chest discomfort, states that since he quit coffee intake he has not had any symptom.    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  Review of Systems  Constitutional: Negative for activity change, appetite change, fatigue, fever and unexpected weight change.  HENT: Negative for dental problem, nosebleeds, sore throat, trouble swallowing and voice change.   Eyes: Negative for redness and visual disturbance.  Respiratory: Negative for apnea, cough, shortness of breath and wheezing.   Cardiovascular: Negative for chest pain, palpitations and leg swelling.  Gastrointestinal: Negative for abdominal pain, blood in stool, nausea and vomiting.  Endocrine: Negative for cold intolerance, heat intolerance, polydipsia, polyphagia and polyuria.  Genitourinary: Negative for decreased urine volume, dysuria, genital sores, hematuria and testicular pain.  Musculoskeletal: Positive for arthralgias (Left shoulder, improved after PT). Negative for  myalgias.  Skin: Negative for color change and rash.  Allergic/Immunologic: Positive for environmental allergies.  Neurological: Negative for dizziness, syncope, weakness and headaches.  Hematological: Negative for adenopathy. Does not bruise/bleed easily.  Psychiatric/Behavioral: Negative for confusion and sleep disturbance. The patient is nervous/anxious.   All other systems reviewed and are negative.    Current Outpatient Medications on File Prior to Visit  Medication Sig Dispense Refill  . meclizine (ANTIVERT) 25 MG tablet Take 1 tablet (25 mg total) by mouth 3 (three) times daily as needed for dizziness. (Patient not taking: Reported on 10/15/2017) 30 tablet 0  . simvastatin (ZOCOR) 20 MG tablet Take 1 tablet by mouth daily with supper. (Patient not taking: Reported on 10/15/2017) 90 tablet 1   No current facility-administered medications on file prior to visit.      Past Medical History:  Diagnosis Date  . Asthma     Past Surgical History:  Procedure Laterality Date  . KNEE ARTHROSCOPY WITH ANTERIOR CRUCIATE LIGAMENT (ACL) REPAIR Left   . SHOULDER SURGERY Right   . VASECTOMY  2018    No Known Allergies  Family History  Problem Relation Age of Onset  . Diabetes Mother   . Cancer Mother        breast cancer  . Peripheral vascular disease Father   . Hyperlipidemia Father   . Diabetes Maternal Grandmother   . Stroke Maternal Grandmother   . Cancer Paternal Uncle [id number]       lung    Social History   Socioeconomic History  . Marital status: Divorced    Spouse name: Not on file  . Number of  children: 1  . Years of education: Not on file  . Highest education level: Not on file  Occupational History  . Not on file  Social Needs  . Financial resource strain: Not on file  . Food insecurity:    Worry: Not on file    Inability: Not on file  . Transportation needs:    Medical: Not on file    Non-medical: Not on file  Tobacco Use  . Smoking status: Former Smoker     Types: Cigarettes  . Smokeless tobacco: Never Used  . Tobacco comment: Quit Dec 2017  Substance and Sexual Activity  . Alcohol use: Yes  . Drug use: Yes    Types: Marijuana  . Sexual activity: Yes  Lifestyle  . Physical activity:    Days per week: Not on file    Minutes per session: Not on file  . Stress: Not on file  Relationships  . Social connections:    Talks on phone: Not on file    Gets together: Not on file    Attends religious service: Not on file    Active member of club or organization: Not on file    Attends meetings of clubs or organizations: Not on file    Relationship status: Not on file  Other Topics Concern  . Not on file  Social History Narrative  . Not on file     Vitals:   10/15/17 1353  BP: 132/86  Pulse: 70  Resp: 12  Temp: 98.2 F (36.8 C)  SpO2: 98%   Body mass index is 25 kg/m.   Wt Readings from Last 3 Encounters:  10/15/17 200 lb (90.7 kg)  10/11/16 197 lb 4 oz (89.5 kg)  09/28/16 197 lb (89.4 kg)    Physical Exam  Nursing note and vitals reviewed. Constitutional: He is oriented to person, place, and time. He appears well-developed and well-nourished. No distress.  HENT:  Head: Normocephalic and atraumatic.  Right Ear: Tympanic membrane, external ear and ear canal normal.  Left Ear: Tympanic membrane, external ear and ear canal normal.  Mouth/Throat: Oropharynx is clear and moist and mucous membranes are normal.  Eyes: Pupils are equal, round, and reactive to light. Conjunctivae and EOM are normal.  Neck: Normal range of motion. No tracheal deviation present. No thyromegaly present.  Cardiovascular: Normal rate and regular rhythm.  No murmur heard. Pulses:      Dorsalis pedis pulses are 2+ on the right side, and 2+ on the left side.  Respiratory: Effort normal and breath sounds normal. No respiratory distress.  GI: Soft. He exhibits no mass. There is no tenderness.  Genitourinary:  Genitourinary Comments: No concerns.    Musculoskeletal: He exhibits no edema or tenderness.  No major deformities appreciated and no signs of synovitis.  Lymphadenopathy:    He has no cervical adenopathy.       Right: No supraclavicular adenopathy present.       Left: No supraclavicular adenopathy present.  Neurological: He is alert and oriented to person, place, and time. He has normal strength. No cranial nerve deficit or sensory deficit. Coordination and gait normal.  Reflex Scores:      Bicep reflexes are 2+ on the right side and 2+ on the left side.      Patellar reflexes are 2+ on the right side and 2+ on the left side. Skin: Skin is warm. No erythema.  Psychiatric: He has a normal mood and affect. Cognition and memory are normal.  Well-groomed,  good eye contact.     ASSESSMENT AND PLAN:   Mr. Eldrick was seen today for annual exam.  Orders Placed This Encounter  Procedures  . Tdap vaccine greater than or equal to 7yo IM  . Basic metabolic panel  . Lipid panel   Lab Results  Component Value Date   CHOL 237 (H) 10/15/2017   HDL 56.00 10/15/2017   LDLCALC 162 (H) 10/15/2017   LDLDIRECT 177.0 10/11/2016   TRIG 96.0 10/15/2017   CHOLHDL 4 10/15/2017   Lab Results  Component Value Date   CREATININE 0.93 10/15/2017   BUN 12 10/15/2017   NA 136 10/15/2017   K 3.8 10/15/2017   CL 101 10/15/2017   CO2 30 10/15/2017    Routine general medical examination at a health care facility  We discussed the importance of regular physical activity and healthy diet for prevention of chronic illness and/or complications. Preventive guidelines reviewed. Vaccination updated. Testicular self examination every 1-2 months to check for masses recommended. Next CPE in a year.   Diabetes mellitus screening -     Basic metabolic panel   Need for Tdap vaccination -     Tdap vaccine greater than or equal to 7yo IM  Mixed hyperlipidemia He will continue nonpharmacologic treatment. Further recommendations will be given  according to lab results. If numbers are stable we will not make changes given the fact he had a coronary calcium score of 0 in 09/2016.     Return in 1 year (on 10/16/2018) for CPE.    Madelaine Whipple G. [place], MD  [facility]. [place] office.

## 2017-10-15 ENCOUNTER — Encounter: Payer: 59 | Admitting: Family Medicine

## 2017-10-15 ENCOUNTER — Ambulatory Visit (INDEPENDENT_AMBULATORY_CARE_PROVIDER_SITE_OTHER): Payer: 59 | Admitting: Family Medicine

## 2017-10-15 ENCOUNTER — Encounter: Payer: Self-pay | Admitting: Family Medicine

## 2017-10-15 VITALS — BP 132/86 | HR 70 | Temp 98.2°F | Resp 12 | Ht 75.0 in | Wt 200.0 lb

## 2017-10-15 DIAGNOSIS — Z23 Encounter for immunization: Secondary | ICD-10-CM

## 2017-10-15 DIAGNOSIS — Z131 Encounter for screening for diabetes mellitus: Secondary | ICD-10-CM | POA: Diagnosis not present

## 2017-10-15 DIAGNOSIS — Z0000 Encounter for general adult medical examination without abnormal findings: Secondary | ICD-10-CM

## 2017-10-15 DIAGNOSIS — E782 Mixed hyperlipidemia: Secondary | ICD-10-CM | POA: Diagnosis not present

## 2017-10-15 LAB — BASIC METABOLIC PANEL
BUN: 12 mg/dL (ref 6–23)
CALCIUM: 9.1 mg/dL (ref 8.4–10.5)
CO2: 30 meq/L (ref 19–32)
Chloride: 101 mEq/L (ref 96–112)
Creatinine, Ser: 0.93 mg/dL (ref 0.40–1.50)
GFR: 94.27 mL/min (ref >60.00)
GLUCOSE: 88 mg/dL (ref 70–99)
Potassium: 3.8 mEq/L (ref 3.5–5.1)
SODIUM: 136 meq/L (ref 135–145)

## 2017-10-15 LAB — LIPID PANEL
CHOL/HDL RATIO: 4
Cholesterol: 237 mg/dL — ABNORMAL HIGH (ref 0–200)
HDL: 56 mg/dL (ref >39.00)
LDL Cholesterol: 162 mg/dL — ABNORMAL HIGH (ref 0–99)
NONHDL: 180.87
Triglycerides: 96 mg/dL (ref 0.0–149.0)
VLDL: 19.2 mg/dL (ref 0.0–40.0)

## 2017-10-15 NOTE — Assessment & Plan Note (Signed)
He will continue nonpharmacologic treatment. Further recommendations will be given according to lab results. If numbers are stable we will not make changes given the fact he had a coronary calcium score of 0 in 09/2016.

## 2017-10-15 NOTE — Patient Instructions (Signed)
A few things to remember from today's visit:   Routine general medical examination at a health care facility  Diabetes mellitus screening - Plan: Basic metabolic panel  Mixed hyperlipidemia - Plan: Lipid panel   At least 150 minutes of moderate exercise per week, daily brisk walking for 15-30 min is a good exercise option. Healthy diet low in saturated (animal) fats and sweets and consisting of fresh fruits and vegetables, lean meats such as fish and white chicken and whole grains.  - Vaccines:  Tdap vaccine every 10 years.  Shingles vaccine recommended at age 43, could be given after 43 years of age but not sure about insurance coverage.  Pneumonia vaccines:  Prevnar 13 at 65 and Pneumovax at 3566.   -Screening recommendations for low/normal risk males:  Screening for diabetes at age 43 and every 3 years. Earlier screening if cardiovascular risk factors.     Colon cancer screening at age 43 and until age 43.  Prostate cancer screening: some controversy, starts usually at 50: Rectal exam and PSA.  Aortic Abdominal Aneurism once between 4865 and 43 years old if ever smoker.  Also recommended:  1. Dental visit- Brush and floss your teeth twice daily; visit your dentist twice a year. 2. Eye doctor- Get an eye exam at least every 2 years. 3. Helmet use- Always wear a helmet when riding a bicycle, motorcycle, rollerblading or skateboarding. 4. Safe sex- If you may be exposed to sexually transmitted infections, use a condom. 5. Seat belts- Seat belts can save your live; always wear one. 6. Smoke/Carbon Monoxide detectors- These detectors need to be installed on the appropriate level of your home. Replace batteries at least once a year. 7. Skin cancer- When out in the sun please cover up and use sunscreen 15 SPF or higher. 8. Violence- If anyone is threatening or hurting you, please tell your healthcare provider.  9. Drink alcohol in moderation- Limit alcohol intake to one drink or  less per day. Never drink and drive.  Please be sure medication list is accurate. If a new problem present, please set up appointment sooner than planned today.

## 2017-10-16 ENCOUNTER — Encounter: Payer: Self-pay | Admitting: Family Medicine

## 2018-09-01 DIAGNOSIS — Z0100 Encounter for examination of eyes and vision without abnormal findings: Secondary | ICD-10-CM | POA: Diagnosis not present

## 2019-01-16 IMAGING — CT CT HEART SCORING
2 series · 16 of 20 positions shown, 18 images · non-contrast
Comparison: None.

CLINICAL DATA: Risk stratification

EXAM:
Coronary Calcium Score
TECHNIQUE: The patient was scanned on a Siemens Somatom 64 slice scanner. Axial
non-contrast 3 mm slices were carried out through the heart. The
data set was analyzed on a dedicated work station and scored using
the Agatson method.

[Series 2: casc 3.0 i36f 2 bestdiast 67 % · axial · 0.38mm/px · z∈[+1190,+1288]mm · 8 of 43 slices shown, 10 images]
[im 5/43  vessel]
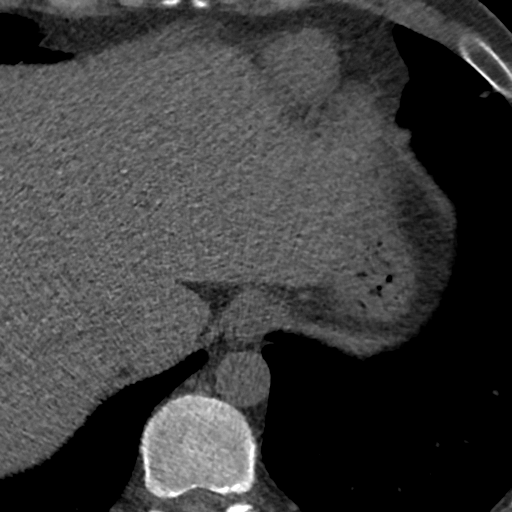
[im 5/43  lung]
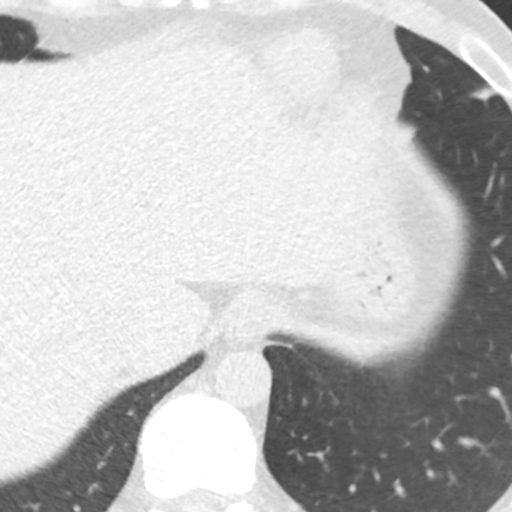
[im 10/43  vessel]
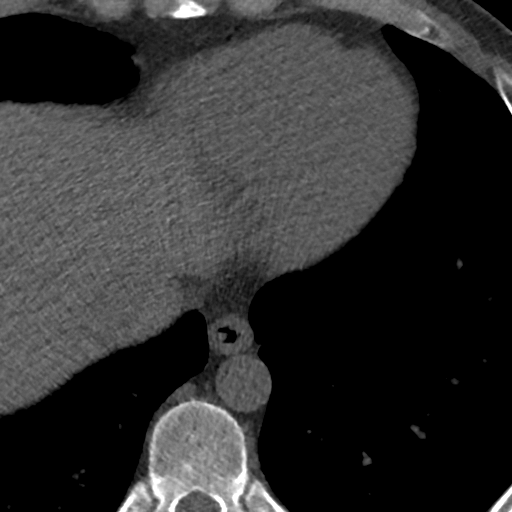
[im 15/43  vessel]
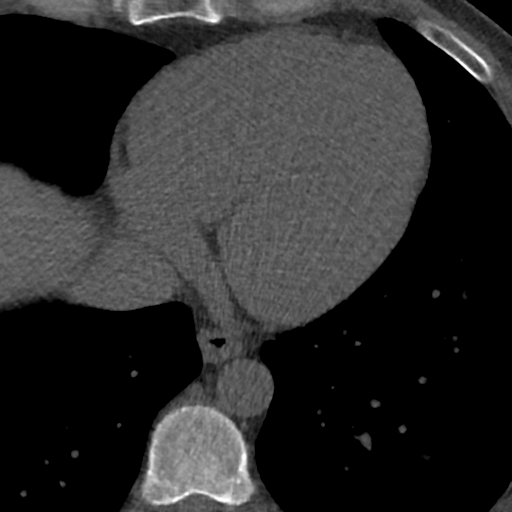
[im 19/43  vessel]
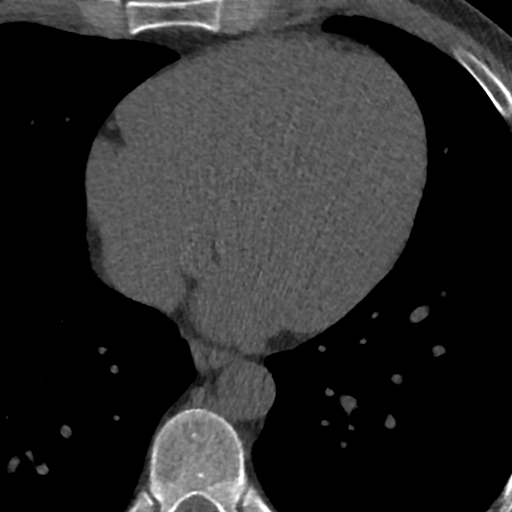
[im 24/43  vessel]
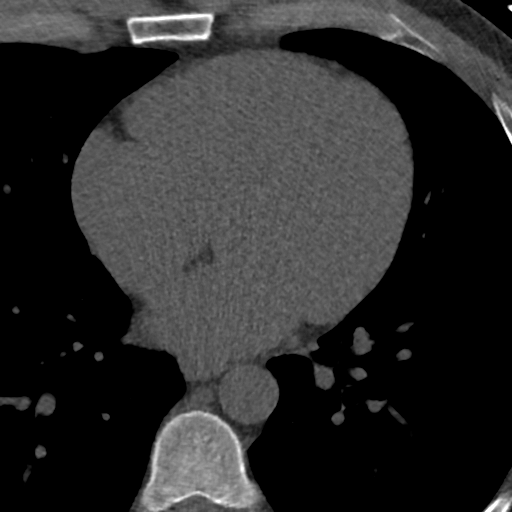
[im 24/43  lung]
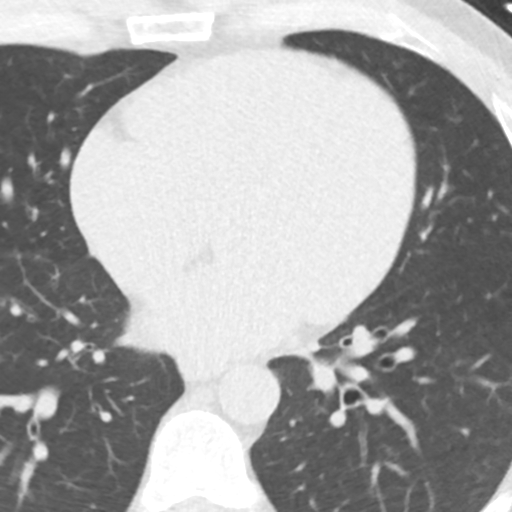
[im 29/43  vessel]
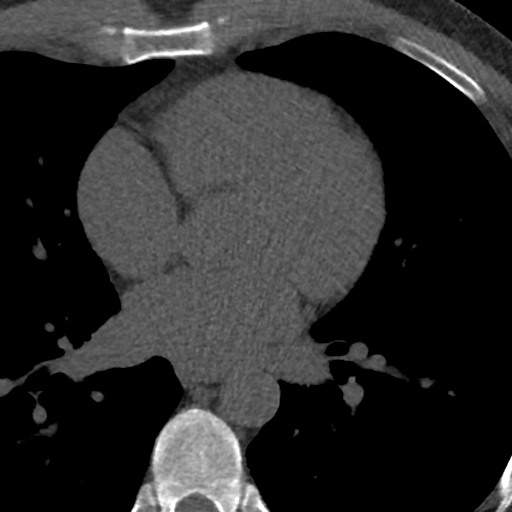
[im 33/43  vessel]
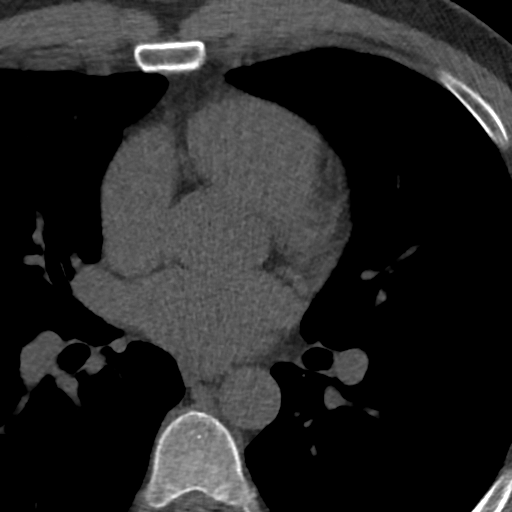
[im 38/43  vessel]
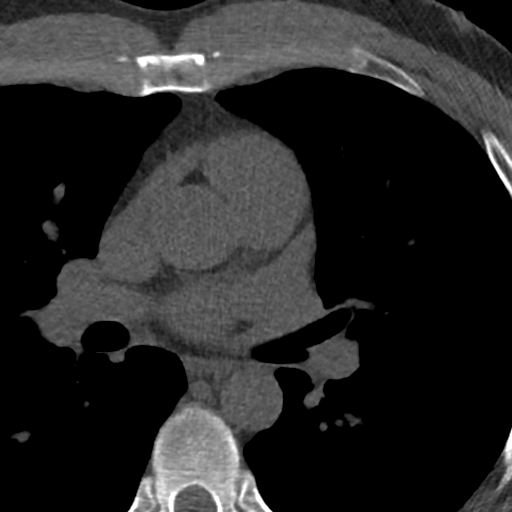

[Series 4: lung st 67 % · axial · 0.68mm/px · z∈[+1190,+1288]mm · 8 of 43 slices shown]
[im 5/43  lung]
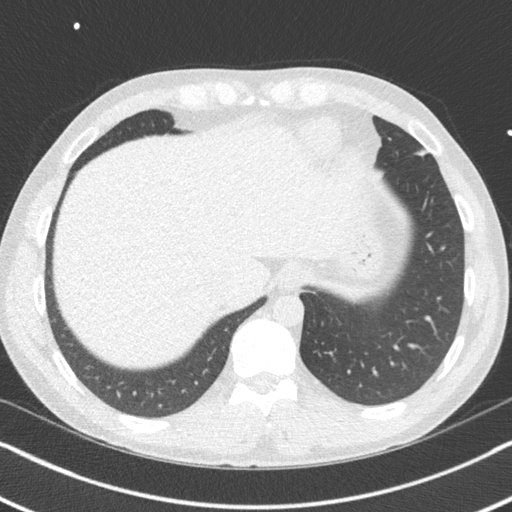
[im 10/43  lung]
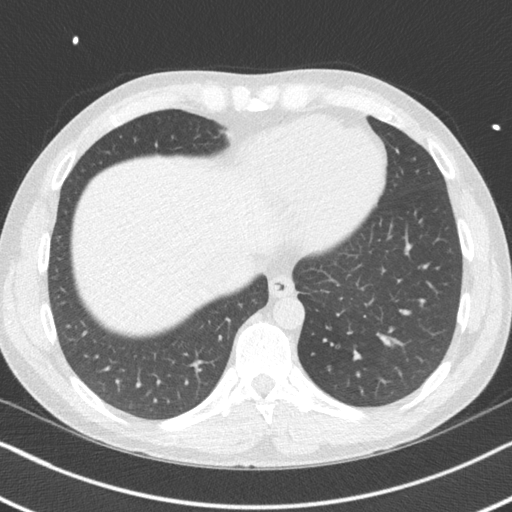
[im 15/43  lung]
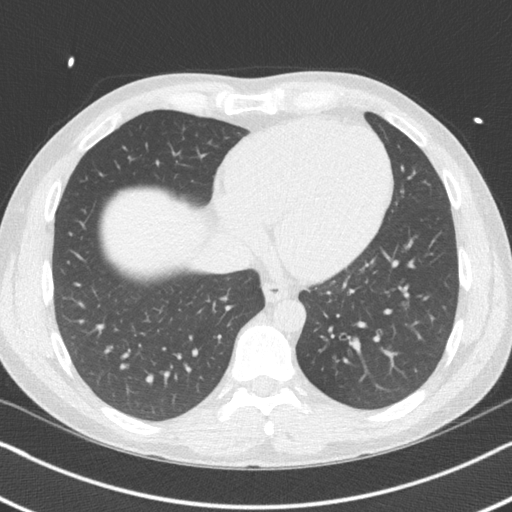
[im 19/43  lung]
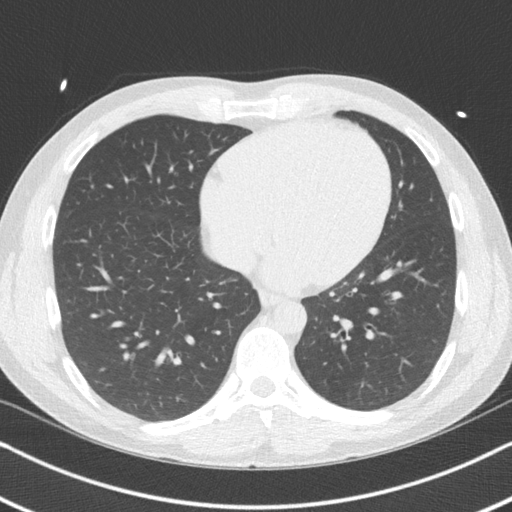
[im 24/43  lung]
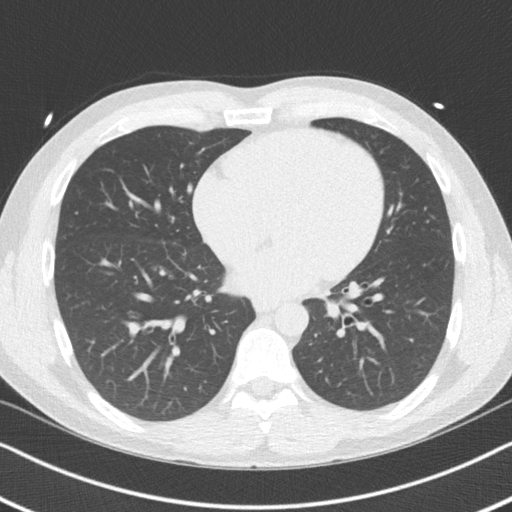
[im 29/43  lung]
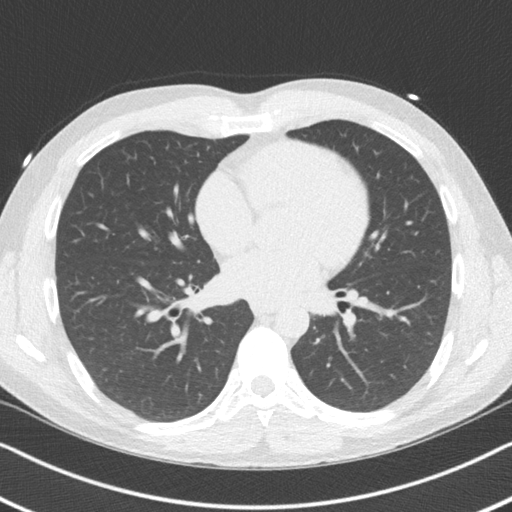
[im 33/43  lung]
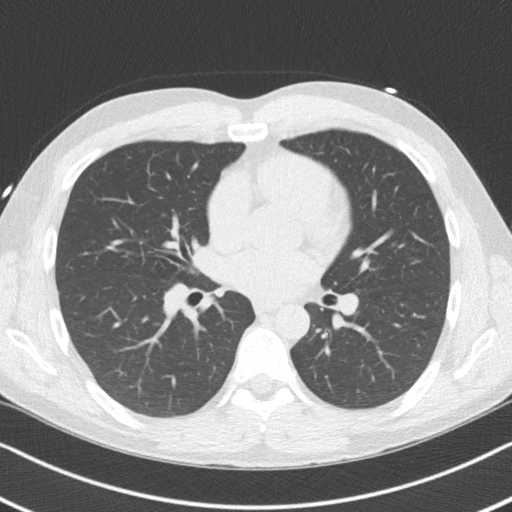
[im 38/43  lung]
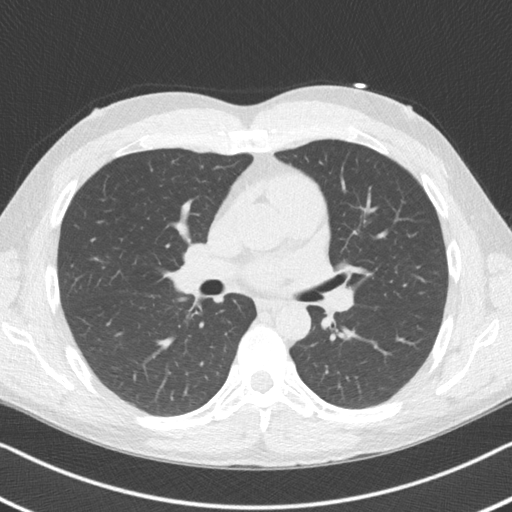

[16 of 20 positions shown; findings below may reference images not displayed]

FINDINGS: Non-cardiac: See separate report from [REDACTED].

Ascending Aorta:  30 mm

Pericardium: Normal

Coronary arteries:  No calcium detected
IMPRESSION: Coronary calcium score of 0 .

Ingel Gz

EXAM:
OVER-READ INTERPRETATION  CT CHEST

The following report is an over-read performed by radiologist Dr.
Klpigbb Moolman [[email or web address]] on 10/03/2016. This over-read
does not include interpretation of cardiac or coronary anatomy or
pathology. The coronary calcium score interpretation by the
cardiologist is attached.
FINDINGS: Cardiovascular: Heart is normal size. Visualized aorta is normal
caliber.

Mediastinum/Nodes: No adenopathy in the visualized lower mediastinum
or hila. Esophagus is unremarkable.

Lungs/Pleura: Visualized lungs are clear.  No effusions.

Upper Abdomen: Imaging into the upper abdomen shows no acute
findings.

Musculoskeletal: No acute bony abnormality.
IMPRESSION: No acute or significant extracardiac abnormality.

## 2022-06-26 ENCOUNTER — Emergency Department (HOSPITAL_BASED_OUTPATIENT_CLINIC_OR_DEPARTMENT_OTHER)
Admission: EM | Admit: 2022-06-26 | Discharge: 2022-06-26 | Disposition: A | Discharge status: Home / Self Care | Payer: BLUE CROSS/BLUE SHIELD | Attending: Emergency Medicine | Admitting: Emergency Medicine

## 2022-06-26 ENCOUNTER — Encounter (HOSPITAL_BASED_OUTPATIENT_CLINIC_OR_DEPARTMENT_OTHER): Payer: Self-pay

## 2022-06-26 ENCOUNTER — Emergency Department (HOSPITAL_BASED_OUTPATIENT_CLINIC_OR_DEPARTMENT_OTHER): Payer: BLUE CROSS/BLUE SHIELD

## 2022-06-26 ENCOUNTER — Other Ambulatory Visit: Payer: Self-pay

## 2022-06-26 DIAGNOSIS — R109 Unspecified abdominal pain: Secondary | ICD-10-CM | POA: Diagnosis not present

## 2022-06-26 LAB — URINALYSIS, ROUTINE W REFLEX MICROSCOPIC
Bilirubin Urine: NEGATIVE
Glucose, UA: NEGATIVE mg/dL
Hgb urine dipstick: NEGATIVE
Ketones, ur: NEGATIVE mg/dL
Leukocytes,Ua: NEGATIVE
Nitrite: NEGATIVE
Protein, ur: NEGATIVE mg/dL
Specific Gravity, Urine: 1.019 (ref 1.005–1.030)
pH: 6.5 (ref 5.0–8.0)

## 2022-06-26 LAB — BASIC METABOLIC PANEL
Anion gap: 12 (ref 5–15)
BUN: 17 mg/dL (ref 6–20)
CO2: 22 mmol/L (ref 22–32)
Calcium: 9.2 mg/dL (ref 8.9–10.3)
Chloride: 104 mmol/L (ref 98–111)
Creatinine, Ser: 1.12 mg/dL (ref 0.61–1.24)
GFR, Estimated: >60 mL/min (ref >60)
Glucose, Bld: 141 mg/dL — ABNORMAL HIGH (ref 70–99)
Potassium: 4 mmol/L (ref 3.5–5.1)
Sodium: 138 mmol/L (ref 135–145)

## 2022-06-26 LAB — CBC
HCT: 41.4 % (ref 39.0–52.0)
Hemoglobin: 14.5 g/dL (ref 13.0–17.0)
MCH: 31.4 pg (ref 26.0–34.0)
MCHC: 35 g/dL (ref 30.0–36.0)
MCV: 89.6 fL (ref 80.0–100.0)
Platelets: 280 10*3/uL (ref 150–400)
RBC: 4.62 MIL/uL (ref 4.22–5.81)
RDW: 12.4 % (ref 11.5–15.5)
WBC: 9.3 10*3/uL (ref 4.0–10.5)
nRBC: 0 % (ref 0.0–0.2)

## 2022-06-26 MED ORDER — ONDANSETRON HCL 4 MG/2ML IJ SOLN
4.0000 mg | Freq: Once | INTRAMUSCULAR | Status: AC
  Administered 2022-06-26: 4 mg via INTRAVENOUS
  Filled 2022-06-26: qty 2

## 2022-06-26 MED ORDER — MORPHINE SULFATE (PF) 4 MG/ML IV SOLN
4.0000 mg | Freq: Once | INTRAVENOUS | Status: AC
  Administered 2022-06-26: 4 mg via INTRAVENOUS
  Filled 2022-06-26: qty 1

## 2022-06-26 MED ORDER — KETOROLAC TROMETHAMINE 15 MG/ML IJ SOLN
15.0000 mg | Freq: Once | INTRAMUSCULAR | Status: DC
  Filled 2022-06-26: qty 1

## 2022-06-26 MED ORDER — SODIUM CHLORIDE 0.9 % IV BOLUS
1000.0000 mL | Freq: Once | INTRAVENOUS | Status: AC
  Administered 2022-06-26: 1000 mL via INTRAVENOUS

## 2022-06-26 NOTE — Discharge Instructions (Signed)
Your back pain is most likely due to a muscular strain.  There is been a lot of research on back pain, unfortunately the only thing that seems to really help is Tylenol and ibuprofen.  Relative rest is also important to not lift greater than 10 pounds bending or twisting at the waist.  Please follow-up with your family physician.  The other thing that really seems to benefit patients is physical therapy which your doctor may send you for.  Please return to the emergency department for new numbness or weakness to your arms or legs. Difficulty with urinating or urinating or pooping on yourself.  Also if you cannot feel toilet paper when you wipe or get a fever.   Take 4 over the counter ibuprofen tablets 3 times a day or 2 over-the-counter naproxen tablets twice a day for pain. Also take tylenol 1000mg (2 extra strength) four times a day.   I found stretching to be helpful for myself personally. 

## 2022-06-26 NOTE — ED Notes (Signed)
Painfree at this time. Refuses Toradol.

## 2022-06-26 NOTE — ED Triage Notes (Signed)
Onset this am of left flank pain.  Denies nausea or vomiting.  No urinary symptoms.  States hurts to take a deep breath.

## 2022-06-26 NOTE — ED Provider Notes (Signed)
[facility] DEPT Provider Note   CSN: [id number] Arrival date & time: 06/26/22  [id number]     History  Chief Complaint  Patient presents with   Flank Pain    William Harding is a 46 y.o. male.  46 yo M with a chief complaint of left flank pain.  This has been going on since this morning.  Is gotten progressively worse.  Nothing seems to make it better or worse.  Has trouble getting comfortable.  Denies urinary symptoms denies fevers denies trauma.  Never had anything like this happen to him before.   Flank Pain       Home Medications Prior to Admission medications   Medication Sig Start Date End Date Taking? Authorizing Provider  meclizine (ANTIVERT) 25 MG tablet Take 1 tablet (25 mg total) by mouth 3 (three) times daily as needed for dizziness. Patient not taking: Reported on 10/15/2017 11/01/16   Deno Etienne, DO  simvastatin (ZOCOR) 20 MG tablet Take 1 tablet by mouth daily with supper. Patient not taking: Reported on 10/15/2017 10/15/16   Martinique, Betty G, MD      Allergies    Patient has no known allergies.    Review of Systems   Review of Systems  Genitourinary:  Positive for flank pain.    Physical Exam Updated Vital Signs BP 127/89   Pulse 63   Temp 97.7 F (36.5 C) (Oral)   Resp 14   Ht 6\' 3"  (1.905 m)   Wt 90.7 kg   SpO2 99%   BMI 25.00 kg/m  Physical Exam Vitals and nursing note reviewed.  Constitutional:      Appearance: He is well-developed.  HENT:     Head: Normocephalic and atraumatic.  Eyes:     Pupils: Pupils are equal, round, and reactive to light.  Neck:     Vascular: No JVD.  Cardiovascular:     Rate and Rhythm: Normal rate and regular rhythm.     Heart sounds: No murmur heard.    No friction rub. No gallop.  Pulmonary:     Effort: No respiratory distress.     Breath sounds: No wheezing.  Abdominal:     General: There is no distension.     Tenderness: There is no abdominal tenderness. There is no guarding or rebound.   Musculoskeletal:        General: Normal range of motion.     Cervical back: Normal range of motion and neck supple.     Comments: No midline spinal tenderness step-offs or deformities.  No CVA tenderness to percussion.  No rash.  Skin:    Coloration: Skin is not pale.     Findings: No rash.  Neurological:     Mental Status: He is alert and oriented to person, place, and time.  Psychiatric:        Behavior: Behavior normal.     ED Results / Procedures / Treatments   Labs (all labs ordered are listed, but only abnormal results are displayed) Labs Reviewed  BASIC METABOLIC PANEL - Abnormal; Notable for the following components:      Result Value   Glucose, Bld 141 (*)    All other components within normal limits  CBC  URINALYSIS, ROUTINE W REFLEX MICROSCOPIC    EKG None  Radiology CT Renal Stone Study  Result Date: 06/26/2022 CLINICAL DATA:  Acute onset of left flank pain this morning. EXAM: CT ABDOMEN AND PELVIS WITHOUT CONTRAST TECHNIQUE: Multidetector CT imaging of the abdomen  and pelvis was performed following the standard protocol without IV contrast. RADIATION DOSE REDUCTION: This exam was performed according to the departmental dose-optimization program which includes automated exposure control, adjustment of the mA and/or kV according to patient size and/or use of iterative reconstruction technique. COMPARISON:  None Available. FINDINGS: Lower chest: No acute findings. Hepatobiliary: No mass visualized on this unenhanced exam. Mild diffuse hepatic steatosis noted. Gallbladder is unremarkable. No evidence of biliary ductal dilatation. Pancreas: No mass or inflammatory process visualized on this unenhanced exam. Spleen:  Within normal limits in size. Adrenals/Urinary tract: No evidence of urolithiasis or hydronephrosis. Unremarkable unopacified urinary bladder. Stomach/Bowel: No evidence of obstruction, inflammatory process, or abnormal fluid collections. Normal appendix  visualized. Vascular/Lymphatic: No pathologically enlarged lymph nodes identified. No evidence of abdominal aortic aneurysm. Reproductive:  No mass or other significant abnormality. Other:  None. Musculoskeletal:  No suspicious bone lesions identified. IMPRESSION: No evidence of urolithiasis, hydronephrosis, or other acute findings. Mild hepatic steatosis. Electronically Signed   By: Danae Orleans M.D.   On: 06/26/2022 11:07    Procedures Procedures    Medications Ordered in ED Medications  ketorolac (TORADOL) 15 MG/ML injection 15 mg (has no administration in time range)  sodium chloride 0.9 % bolus 1,000 mL (0 mLs Intravenous Stopped 06/26/22 1105)  morphine (PF) 4 MG/ML injection 4 mg (4 mg Intravenous Given 06/26/22 0957)  ondansetron (ZOFRAN) injection 4 mg (4 mg Intravenous Given 06/26/22 7829)    ED Course/ Medical Decision Making/ A&P                           Medical Decision Making Amount and/or Complexity of Data Reviewed Labs: ordered. Radiology: ordered.  Risk Prescription drug management.   46 yo M with a chief complaint of left flank pain.  Most likely kidney stone by history and physical.  No history of the same.  Will obtain a stone study.  UA.  Treat pain aggressively.  Reassess.  Feeling much better on repeat assessment.  CT stone study without obvious nephrolithiasis on my independent interpretation.  Radiology read also was negative.  UA negative for infection.  No concerning laboratory findings.  Will treat as musculoskeletal.  PCP follow-up.  12:33 PM:  I have discussed the diagnosis/risks/treatment options with the patient and family.  Evaluation and diagnostic testing in the emergency department does not suggest an emergent condition requiring admission or immediate intervention beyond what has been performed at this time.  They will follow up with PCP. We also discussed returning to the ED immediately if new or worsening sx occur. We discussed the sx which  are most concerning (e.g., sudden worsening pain, fever, inability to tolerate by mouth) that necessitate immediate return. Medications administered to the patient during their visit and any new prescriptions provided to the patient are listed below.  Medications given during this visit Medications  ketorolac (TORADOL) 15 MG/ML injection 15 mg (has no administration in time range)  sodium chloride 0.9 % bolus 1,000 mL (0 mLs Intravenous Stopped 06/26/22 1105)  morphine (PF) 4 MG/ML injection 4 mg (4 mg Intravenous Given 06/26/22 0957)  ondansetron (ZOFRAN) injection 4 mg (4 mg Intravenous Given 06/26/22 0956)     The patient appears reasonably screen and/or stabilized for discharge and I doubt any other medical condition or other [facility] requiring further screening, evaluation, or treatment in the ED at this time prior to discharge.          Final Clinical  Impression(s) / ED Diagnoses Final diagnoses:  Left flank pain    Rx / DC Orders ED Discharge Orders     None         Deno Etienne, DO 06/26/22 1233

## 2023-08-23 ENCOUNTER — Ambulatory Visit: Payer: Self-pay | Admitting: General Practice

## 2023-08-23 DIAGNOSIS — J45909 Unspecified asthma, uncomplicated: Secondary | ICD-10-CM | POA: Insufficient documentation

## 2023-08-23 NOTE — Telephone Encounter (Signed)
   Chief Complaint: right elbow pain Symptoms: sore Frequency: comes and goes Disposition: [] ED /[] Urgent Care (no appt availability in office) / [] Appointment(In office/virtual)/ [x]  [facility]/ [] Home Care/ [] Refused Recommended Disposition /[] [facility]/ []  Follow-up with PCP Additional Notes: Pt complaining of right elbow (bony point) pain from years of playing golf and exercising. Pt rated pain 3/10. PT also mentioned left and right wrist discomfrot. Pt rated that pain as a 1/10. Pt stated this has been bothering him for 8-10 weeks. He has had this before and was given a steroid shot. Pt is looking for suggestions to get relief. Pt does not have a PCP. Per protocol, pt to be seen within  2 weeks. Pt has appt Monday NP Vincente. RN gave care advice and pt verbalized understanding.      Copied from CRM [id number]. Topic: Appointments - Appointment Scheduling >> Aug 23, 2023  3:24 PM Jinnie C wrote: Patient/patient representative is calling to schedule an appointment. Patient stated that he has elbow tendonitis and trying to find a doctor to check it so that he can get some medication or shot for it. New appointment is Feb. 10 @ 9:40am Reason for Disposition  [1] MILD pain (e.g., does not interfere with normal activities) AND [2] present > 7 days  Answer Assessment - Initial Assessment Questions 1. ONSET: When did the pain start?     8-10 weeks 2. LOCATION: Where is the pain located?     Right/left  wrist:  Right elbow- bottom  3. PAIN: How bad is the pain? (Scale 1-10; or mild, moderate, severe)   - MILD (1-3): doesn't interfere with normal activities.   - MODERATE (4-7): interferes with normal activities (e.g., work or school) or awakens from sleep.   - SEVERE (8-10): excruciating pain, unable to do any normal activities, unable to use arm at all.     3 4. WORK OR EXERCISE: Has there been any recent work or exercise that involved this part of the body?      Exercise 5. CAUSE: What do you think is causing the elbow pain?     Working out and naval architect 6. OTHER SYMPTOMS: Do you have any other symptoms? (e.g., neck pain, elbow swelling, rash, fever)     Denies  Protocols used: Elbow Pain-A-AH

## 2023-08-26 ENCOUNTER — Encounter: Payer: Self-pay | Admitting: General Practice

## 2023-08-26 ENCOUNTER — Ambulatory Visit (INDEPENDENT_AMBULATORY_CARE_PROVIDER_SITE_OTHER)
Admission: RE | Admit: 2023-08-26 | Discharge: 2023-08-26 | Disposition: A | Discharge status: Home / Self Care | Payer: 59 | Source: Ambulatory Visit | Attending: General Practice | Admitting: General Practice

## 2023-08-26 ENCOUNTER — Ambulatory Visit (INDEPENDENT_AMBULATORY_CARE_PROVIDER_SITE_OTHER): Payer: 59 | Admitting: General Practice

## 2023-08-26 VITALS — BP 120/86 | HR 84 | Temp 98.0°F | Ht 74.5 in | Wt 204.0 lb

## 2023-08-26 DIAGNOSIS — M25521 Pain in right elbow: Secondary | ICD-10-CM

## 2023-08-26 DIAGNOSIS — Z1159 Encounter for screening for other viral diseases: Secondary | ICD-10-CM

## 2023-08-26 DIAGNOSIS — Z114 Encounter for screening for human immunodeficiency virus [HIV]: Secondary | ICD-10-CM

## 2023-08-26 DIAGNOSIS — J4520 Mild intermittent asthma, uncomplicated: Secondary | ICD-10-CM

## 2023-08-26 DIAGNOSIS — Z1211 Encounter for screening for malignant neoplasm of colon: Secondary | ICD-10-CM | POA: Insufficient documentation

## 2023-08-26 LAB — CBC
HCT: 43.2 % (ref 39.0–52.0)
Hemoglobin: 14.5 g/dL (ref 13.0–17.0)
MCHC: 33.6 g/dL (ref 30.0–36.0)
MCV: 91.9 fL (ref 78.0–100.0)
Platelets: 243 10*3/uL (ref 150.0–400.0)
RBC: 4.7 Mil/uL (ref 4.22–5.81)
RDW: 13.4 % (ref 11.5–15.5)
WBC: 5 10*3/uL (ref 4.0–10.5)

## 2023-08-26 NOTE — Assessment & Plan Note (Signed)
 Due for a colorectal cancer screening.

## 2023-08-26 NOTE — Assessment & Plan Note (Signed)
 Controlled.  Uses Albuterol sparingly.

## 2023-08-26 NOTE — Patient Instructions (Addendum)
 Stop by the lab prior to leaving today. I will notify you of your results once received.   Complete xray(s) prior to leaving today. I will notify you of your results once received.  Cologuard has sent.   Follow up with 2 weeks.   It was a pleasure meeting you!

## 2023-08-26 NOTE — Progress Notes (Signed)
 New Patient Office Visit  Subjective    Patient ID: William Harding, male    DOB: January 22, 1976  Age: 48 y.o. MRN: [id number]  CC:  Chief Complaint  Patient presents with   New Patient (Initial Visit)   Elbow Pain    On right side; also includes wrist. Does have a brace to wear when needed and does not really take anything for the pain.     HPI William Harding is a 48 y.o. male presents to establish care.  Last PCP/physical/labs: three years ago.   Bilateral Elbow pain: plays a lot of golf and lifts weight 6 days ago. He had left elbow pain 8 years ago is controlled. Right elbow pain worsening over 2.5 months ago. He had had a injection on 11/06/22, which seems to have wore off. He is still playing golf and lifting pain. Pain is localized, describes as a sharp, shooting pain. Worse in the morning after waking. Feels still. Loosens up through out the day. He has tried ice and Voltaren gel with minimal relief. He does have brace to wear and uses it occasionally. He does have pain in the right wrist which seems to be improving after he started taking the Tumeric.    Outpatient Encounter Medications as of 08/26/2023  Medication Sig   albuterol (VENTOLIN HFA) 108 (90 Base) MCG/ACT inhaler 1 puff as needed Inhalation every 4 hrs for 10 days   ELDERBERRY PO Take by mouth.   Multiple Vitamin (MULTIVITAMIN) capsule Take 1 capsule by mouth daily.   TURMERIC PO Take 500 mg by mouth daily.   [DISCONTINUED] meclizine  (ANTIVERT ) 25 MG tablet Take 1 tablet (25 mg total) by mouth 3 (three) times daily as needed for dizziness. (Patient not taking: Reported on 10/15/2017)   [DISCONTINUED] NONFORMULARY OR COMPOUNDED ITEM fluorouracil 5% + calcipotriene 0.005%   [DISCONTINUED] simvastatin  (ZOCOR ) 20 MG tablet Take 1 tablet by mouth daily with supper. (Patient not taking: Reported on 10/15/2017)   No facility-administered encounter medications on file as of 08/26/2023.    Past Medical History:  Diagnosis Date   Asthma     Mixed hyperlipidemia 10/14/2016    Past Surgical History:  Procedure Laterality Date   KNEE ARTHROSCOPY WITH ANTERIOR CRUCIATE LIGAMENT (ACL) REPAIR Left    SHOULDER SURGERY Right    VASECTOMY  2018    Family History  Problem Relation Age of Onset   Diabetes Mother    Cancer Mother        breast cancer   Peripheral vascular disease Father    Hyperlipidemia Father    Cancer Paternal Uncle 44       lung   Diabetes Maternal Grandmother    Stroke Maternal Grandfather     Social History   Socioeconomic History   Marital status: Married    Spouse name: William Harding   Number of children: 1   Years of education: Not on file   Highest education level: Not on file  Occupational History   Not on file  Tobacco Use   Smoking status: Former    Types: Cigarettes   Smokeless tobacco: Never   Tobacco comments:    Quit Dec 2017  Vaping Use   Vaping status: Never Used  Substance and Sexual Activity   Alcohol use: Yes    Alcohol/week: 10.0 standard drinks of alcohol    Types: 10 Glasses of wine per week   Drug use: Not Currently    Types: Marijuana   Sexual activity: Yes  Partners: Female  Other Topics Concern   Not on file  Social History Narrative   Not on file   Social Drivers of Health   Financial Resource Strain: Not on file  Food Insecurity: Not on file  Transportation Needs: Not on file  Physical Activity: Not on file  Stress: Not on file  Social Connections: Not on file  Intimate Partner Violence: Not on file    Review of Systems  Constitutional:  Negative for chills and fever.  Respiratory:  Negative for shortness of breath.   Cardiovascular:  Negative for chest pain.  Gastrointestinal:  Negative for abdominal pain, constipation, diarrhea, heartburn, nausea and vomiting.  Genitourinary:  Negative for dysuria, frequency and urgency.  Musculoskeletal:  Positive for joint pain.       Elbow and wrist pain.  Neurological:  Negative for dizziness and headaches.   Psychiatric/Behavioral:  Negative for depression and suicidal ideas. The patient is not nervous/anxious.         Objective    BP 120/86 (BP Location: Left Arm, Patient Position: Sitting, Cuff Size: Normal)   Pulse 84   Temp 98 F (36.7 C) (Oral)   Ht 6' 2.5" (1.892 m)   Wt 204 lb (92.5 kg)   SpO2 97%   BMI 25.84 kg/m   Physical Exam Vitals and nursing note reviewed.  Constitutional:      Appearance: Normal appearance.  Cardiovascular:     Rate and Rhythm: Normal rate and regular rhythm.     Pulses: Normal pulses.     Heart sounds: Normal heart sounds.  Pulmonary:     Effort: Pulmonary effort is normal.     Breath sounds: Normal breath sounds.  Musculoskeletal:        General: No swelling, tenderness, deformity or signs of injury.     Right elbow: No swelling. Decreased range of motion.     Left elbow: No swelling. Normal range of motion. No tenderness.     Right wrist: No swelling or snuff box tenderness.     Left wrist: No swelling or snuff box tenderness. Normal range of motion.  Skin:    General: Skin is warm.  Neurological:     Mental Status: He is alert and oriented to person, place, and time.  Psychiatric:        Mood and Affect: Mood normal.        Behavior: Behavior normal.        Thought Content: Thought content normal.        Judgment: Judgment normal.         Assessment & Plan:  Need for hepatitis C screening test -     Hepatitis C antibody  Screening for HIV (human immunodeficiency virus) -     HIV Antibody (routine testing w rflx)  Screening for colon cancer Assessment & Plan: Due for a colorectal cancer screening.  Orders: -     Cologuard  Right elbow pain Assessment & Plan: Suspect elbow tendinopathy due to golf.   Has had an injection in April, 2024.  Discussed treatment option.  Declines prednisone at this time due to a scheduled trip.  Declines Naproxen.   Elbow x-ray ordered.  Await results.  Consider physical therapy,  and/or ortho referral for injection.  Orders: -     CBC -     DG Elbow 2 Views Right  Mild intermittent asthma without complication Assessment & Plan: Controlled.  Uses Albuterol sparingly.       Return in about  2 weeks (around 09/09/2023) for elbow pain.Jolanda Nation, NP

## 2023-08-26 NOTE — Assessment & Plan Note (Signed)
 Suspect elbow tendinopathy due to golf.   Has had an injection in April, 2024.  Discussed treatment option.  Declines prednisone at this time due to a scheduled trip.  Declines Naproxen.   Elbow x-ray ordered.  Await results.  Consider physical therapy, and/or ortho referral for injection.

## 2023-08-27 LAB — HIV ANTIBODY (ROUTINE TESTING W REFLEX): HIV 1&2 Ab, 4th Generation: NONREACTIVE

## 2023-08-27 LAB — HEPATITIS C ANTIBODY: Hepatitis C Ab: NONREACTIVE

## 2023-08-28 ENCOUNTER — Encounter: Payer: Self-pay | Admitting: General Practice

## 2023-09-08 NOTE — Progress Notes (Unsigned)
   William Naser T. Lorence Nagengast, MD, CAQ Sports Medicine [facility] at [facility] [address] [place], [address]  Phone: [phone]  FAX: [phone]  William Harding - 48 y.o. male  MRN [id number]  Date of Birth: 1975/11/22  Date: 09/09/2023  PCP: Modesto Charon, NP  Referral: Modesto Charon, NP  No chief complaint on file.  Subjective:   William Harding is a 48 y.o. very pleasant male patient with There is no height or weight on file to calculate BMI. who presents with the following:  Patient presents today for follow-up for what was previously described as medial epicondylitis and he had a medial epicondylar injection of April 2024.  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    Review of Systems is noted in the HPI, as appropriate  Objective:   There were no vitals taken for this visit.  GEN: No acute distress; alert,appropriate. PULM: Breathing comfortably in no respiratory distress PSYCH: Normally interactive.   Laboratory and Imaging Data:  Assessment and Plan:   ***

## 2023-09-09 ENCOUNTER — Encounter: Payer: Self-pay | Admitting: Family Medicine

## 2023-09-09 ENCOUNTER — Ambulatory Visit: Payer: 59 | Admitting: Family Medicine

## 2023-09-09 VITALS — BP 110/76 | HR 81 | Temp 97.9°F | Ht 74.5 in | Wt 204.5 lb

## 2023-09-09 DIAGNOSIS — M7701 Medial epicondylitis, right elbow: Secondary | ICD-10-CM | POA: Diagnosis not present

## 2023-09-09 MED ORDER — TRIAMCINOLONE ACETONIDE 40 MG/ML IJ SUSP
30.0000 mg | Freq: Once | INTRAMUSCULAR | Status: AC
  Administered 2023-09-09: 30 mg via INTRA_ARTICULAR

## 2024-07-21 ENCOUNTER — Encounter: Payer: Self-pay | Admitting: Family Medicine

## 2024-07-26 NOTE — Progress Notes (Unsigned)
" ° ° ° °  William Laffey T. Janine Reller, MD, CAQ Sports Medicine [facility] at [facility] [address] [place], [address]  Phone: [phone]  FAX: [phone]  William Harding - 49 y.o. male  MRN [id number]  Date of Birth: October 13, 1975  Date: 07/27/2024  PCP: William Shivers, NP  Referral: William Shivers, NP  No chief complaint on file.  Subjective:   William Harding is a 49 y.o. very pleasant male patient with There is no height or weight on file to calculate BMI. who presents with the following:  Discussed the use of AI scribe software for clinical note transcription with the patient, who gave verbal consent to proceed.  Patient presents with ongoing right-sided medial epicondylitis.  I actually saw him 1 year ago for the same.  At that time, I did have him start home rehab and did a medial epicondylar injection. History of Present Illness     Review of Systems is noted in the HPI, as appropriate  Objective:   There were no vitals taken for this visit.  GEN: No acute distress; alert,appropriate. PULM: Breathing comfortably in no respiratory distress PSYCH: Normally interactive.   Laboratory and Imaging Data:  Assessment and Plan:   No diagnosis found. Assessment & Plan   Medication Management during today's office visit: No orders of the defined types were placed in this encounter.  There are no discontinued medications.  Orders placed today for conditions managed today: No orders of the defined types were placed in this encounter.   Disposition: No follow-ups on file.  [employer] speech-to-text software was used for transcription in this dictation.  Possible transcriptional errors can occur using Animal nutritionist.   Signed,  Jacques DASEN. William Mendonca, MD   Outpatient Encounter Medications as of 07/27/2024  Medication Sig   albuterol (VENTOLIN HFA) 108 (90 Base) MCG/ACT inhaler 1 puff as needed Inhalation every 4 hrs for 10 days   ELDERBERRY PO Take  by mouth.   Multiple Vitamin (MULTIVITAMIN) capsule Take 1 capsule by mouth daily.   TURMERIC PO Take 500 mg by mouth daily.   No facility-administered encounter medications on file as of 07/27/2024.   "

## 2024-07-27 ENCOUNTER — Ambulatory Visit: Admitting: Family Medicine

## 2024-07-27 ENCOUNTER — Encounter: Payer: Self-pay | Admitting: Family Medicine

## 2024-07-27 VITALS — BP 116/80 | HR 86 | Temp 98.2°F | Ht 74.5 in | Wt 212.5 lb

## 2024-07-27 DIAGNOSIS — M7701 Medial epicondylitis, right elbow: Secondary | ICD-10-CM
